# Patient Record
Sex: Male | Born: 1985 | Race: White | Hispanic: No | Marital: Single | Smoking: Never smoker
Health system: Southern US, Community
[De-identification: ages and names within clinical notes are randomized; demographics above are authoritative.]

## PROBLEM LIST (undated history)

## (undated) DIAGNOSIS — A4902 Methicillin resistant Staphylococcus aureus infection, unspecified site: Secondary | ICD-10-CM

## (undated) DIAGNOSIS — I10 Essential (primary) hypertension: Secondary | ICD-10-CM

## (undated) DIAGNOSIS — T7840XA Allergy, unspecified, initial encounter: Secondary | ICD-10-CM

## (undated) HISTORY — DX: Methicillin resistant Staphylococcus aureus infection, unspecified site: A49.02

## (undated) HISTORY — DX: Essential (primary) hypertension: I10

## (undated) HISTORY — DX: Allergy, unspecified, initial encounter: T78.40XA

---
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
## 2005-10-21 ENCOUNTER — Emergency Department (HOSPITAL_COMMUNITY): Admission: EM | Admit: 2005-10-21 | Discharge: 2005-10-21 | Payer: Self-pay | Admitting: Emergency Medicine

## 2005-10-23 ENCOUNTER — Emergency Department (HOSPITAL_COMMUNITY): Admission: EM | Admit: 2005-10-23 | Discharge: 2005-10-23 | Payer: Self-pay | Admitting: Family Medicine

## 2005-11-25 ENCOUNTER — Emergency Department (HOSPITAL_COMMUNITY): Admission: EM | Admit: 2005-11-25 | Discharge: 2005-11-25 | Payer: Self-pay | Admitting: Family Medicine

## 2005-11-27 ENCOUNTER — Emergency Department (HOSPITAL_COMMUNITY): Admission: EM | Admit: 2005-11-27 | Discharge: 2005-11-27 | Payer: Self-pay | Admitting: Family Medicine

## 2007-10-06 ENCOUNTER — Emergency Department (HOSPITAL_COMMUNITY): Admission: EM | Admit: 2007-10-06 | Discharge: 2007-10-06 | Payer: Self-pay | Admitting: Emergency Medicine

## 2009-03-09 ENCOUNTER — Emergency Department (HOSPITAL_COMMUNITY): Admission: EM | Admit: 2009-03-09 | Discharge: 2009-03-09 | Payer: Self-pay | Admitting: Family Medicine

## 2011-06-20 LAB — POCT URINALYSIS DIP (DEVICE)
Glucose, UA: NEGATIVE
Nitrite: NEGATIVE
Operator id: 235561
Protein, ur: 30 — AB
Urobilinogen, UA: 1

## 2011-06-20 LAB — URINE CULTURE

## 2012-07-10 ENCOUNTER — Ambulatory Visit (INDEPENDENT_AMBULATORY_CARE_PROVIDER_SITE_OTHER): Payer: BC Managed Care – PPO | Admitting: Family Medicine

## 2012-07-10 VITALS — BP 142/92 | HR 77 | Temp 97.9°F | Resp 16 | Ht 69.0 in | Wt 203.0 lb

## 2012-07-10 DIAGNOSIS — Z0000 Encounter for general adult medical examination without abnormal findings: Secondary | ICD-10-CM

## 2012-07-10 LAB — POCT CBC
Granulocyte percent: 68.5 % (ref 37–80)
HCT, POC: 50.9 % (ref 43.5–53.7)
Hemoglobin: 16.8 g/dL (ref 14.1–18.1)
Lymph, poc: 1.9 (ref 0.6–3.4)
MCH, POC: 29.3 pg (ref 27–31.2)
MCHC: 33 g/dL (ref 31.8–35.4)
MCV: 88.6 fL (ref 80–97)
MID (cbc): 0.5 (ref 0–0.9)
MPV: 8.7 fL (ref 0–99.8)
POC Granulocyte: 5.3 (ref 2–6.9)
POC LYMPH PERCENT: 25.3 %L (ref 10–50)
POC MID %: 6.2 %M (ref 0–12)
Platelet Count, POC: 291 10*3/uL (ref 142–424)
RBC: 5.74 M/uL (ref 4.69–6.13)
RDW, POC: 13.6 %
WBC: 7.7 10*3/uL (ref 4.6–10.2)

## 2012-07-10 NOTE — Progress Notes (Signed)
  [facility]:  Office Visit  Chief Complaint:  Chief Complaint  Patient presents with  . Annual Exam    for health insurance, form, needs lipid     HPI: Calvin Sandoval is a 26 y.o. male who complains of  Annual exam. He is a Psychologist, occupational. 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 No CP/SOB.   Past Medical History  Diagnosis Date  . MRSA infection     on elbow and knee   History reviewed. No pertinent past surgical history. History   Social History  . Marital Status: Single    Spouse Name: N/A    Number of Children: N/A  . Years of Education: N/A   Social History Main Topics  . Smoking status: Former Smoker -- 1.0 packs/day for .1 years    Types: Cigarettes  . Smokeless tobacco: Current User    Types: Chew  . Alcohol Use: Yes  . Drug Use: No  . Sexually Active: None   Other Topics Concern  . None   Social History Narrative  . None   Family History  Problem Relation Age of Onset  . Hypertension Mother   . Hypertension Father   . Diabetes Sister    Allergies  Allergen Reactions  . Sulfa Antibiotics    Prior to Admission medications   Not on File     ROS: The patient denies fevers, chills, night sweats, unintentional weight loss, chest pain, palpitations, wheezing, dyspnea on exertion, nausea, vomiting, abdominal pain, dysuria, hematuria, melena, numbness, weakness, or tingling.  All other systems have been reviewed and were otherwise negative with the exception of those mentioned in the HPI and as above.    PHYSICAL EXAM: Filed Vitals:   07/10/12 1107  BP: 142/92  Pulse: 77  Temp: 97.9 F (36.6 C)  Resp: 16   Filed Vitals:   07/10/12 1107  Height: 5\' 9"  (1.753 m)  Weight: 203 lb (92.08 kg)   Body mass index is 29.98 kg/(m^2).  General: Alert, no acute distress HEENT:  Normocephalic, atraumatic, oropharynx patent.  Cardiovascular:  Regular rate and rhythm, no rubs murmurs or gallops.  No Carotid bruits, radial pulse intact. No pedal edema.    Respiratory: Clear to auscultation bilaterally.  No wheezes, rales, or rhonchi.  No cyanosis, no use of accessory musculature GI: No organomegaly, abdomen is soft and non-tender, positive bowel sounds.  No masses. Skin: No rashes. Neurologic: Facial musculature symmetric. Psychiatric: Patient is appropriate throughout our interaction. Lymphatic: No cervical lymphadenopathy Musculoskeletal: Gait intact. GU nl, testicles nl, scrotum nl.   LABS:   EKG/XRAY:   Primary read interpreted by Dr. Conley Rolls at [facility].    ASSESSMENT/PLAN: Encounter Diagnosis  Name Primary?  . Annual physical exam Yes   Labs pending Doing well Anticipatory guidance given for age appropriate annual    Rockne Coons, DO 07/10/2012 11:26 AM

## 2012-07-11 LAB — COMPREHENSIVE METABOLIC PANEL WITH GFR
AST: 24 U/L (ref 0–37)
Albumin: 4.8 g/dL (ref 3.5–5.2)
Alkaline Phosphatase: 57 U/L (ref 39–117)
BUN: 8 mg/dL (ref 6–23)
Calcium: 10.3 mg/dL (ref 8.4–10.5)
Chloride: 102 meq/L (ref 96–112)
Glucose, Bld: 96 mg/dL (ref 70–99)
Potassium: 4.3 meq/L (ref 3.5–5.3)
Sodium: 140 meq/L (ref 135–145)
Total Protein: 7.6 g/dL (ref 6.0–8.3)

## 2012-07-11 LAB — COMPREHENSIVE METABOLIC PANEL
ALT: 32 U/L (ref 0–53)
CO2: 28 mEq/L (ref 19–32)
Creat: 1.15 mg/dL (ref 0.50–1.35)
Total Bilirubin: 0.6 mg/dL (ref 0.3–1.2)

## 2012-07-11 LAB — LIPID PANEL
Cholesterol: 162 mg/dL (ref 0–200)
HDL: 41 mg/dL (ref >39)
LDL Cholesterol: 105 mg/dL — ABNORMAL HIGH (ref 0–99)
Total CHOL/HDL Ratio: 4 Ratio
Triglycerides: 80 mg/dL (ref <150)
VLDL: 16 mg/dL (ref 0–40)

## 2012-07-13 ENCOUNTER — Encounter (HOSPITAL_BASED_OUTPATIENT_CLINIC_OR_DEPARTMENT_OTHER): Payer: Self-pay | Admitting: *Deleted

## 2012-07-13 ENCOUNTER — Other Ambulatory Visit: Payer: Self-pay | Admitting: Orthopedic Surgery

## 2012-07-13 ENCOUNTER — Encounter (HOSPITAL_BASED_OUTPATIENT_CLINIC_OR_DEPARTMENT_OTHER): Payer: Self-pay | Admitting: Anesthesiology

## 2012-07-13 ENCOUNTER — Ambulatory Visit (HOSPITAL_BASED_OUTPATIENT_CLINIC_OR_DEPARTMENT_OTHER)
Admission: RE | Admit: 2012-07-13 | Discharge: 2012-07-13 | Disposition: A | Discharge status: Home / Self Care | Payer: Self-pay | Source: Ambulatory Visit | Attending: Orthopedic Surgery | Admitting: Orthopedic Surgery

## 2012-07-13 ENCOUNTER — Encounter (HOSPITAL_BASED_OUTPATIENT_CLINIC_OR_DEPARTMENT_OTHER)
Admission: RE | Disposition: A | Discharge status: Home / Self Care | Payer: Self-pay | Source: Ambulatory Visit | Attending: Orthopedic Surgery

## 2012-07-13 ENCOUNTER — Ambulatory Visit (HOSPITAL_BASED_OUTPATIENT_CLINIC_OR_DEPARTMENT_OTHER): Payer: Self-pay | Admitting: Anesthesiology

## 2012-07-13 DIAGNOSIS — S6710XA Crushing injury of unspecified finger(s), initial encounter: Secondary | ICD-10-CM | POA: Insufficient documentation

## 2012-07-13 DIAGNOSIS — Z8614 Personal history of Methicillin resistant Staphylococcus aureus infection: Secondary | ICD-10-CM | POA: Insufficient documentation

## 2012-07-13 DIAGNOSIS — Y9269 Other specified industrial and construction area as the place of occurrence of the external cause: Secondary | ICD-10-CM | POA: Insufficient documentation

## 2012-07-13 DIAGNOSIS — S62639B Displaced fracture of distal phalanx of unspecified finger, initial encounter for open fracture: Secondary | ICD-10-CM

## 2012-07-13 DIAGNOSIS — S62639A Displaced fracture of distal phalanx of unspecified finger, initial encounter for closed fracture: Secondary | ICD-10-CM | POA: Insufficient documentation

## 2012-07-13 DIAGNOSIS — Y990 Civilian activity done for income or pay: Secondary | ICD-10-CM | POA: Insufficient documentation

## 2012-07-13 DIAGNOSIS — X58XXXA Exposure to other specified factors, initial encounter: Secondary | ICD-10-CM | POA: Insufficient documentation

## 2012-07-13 HISTORY — PX: NAILBED REPAIR: SHX5028

## 2012-07-13 SURGERY — OPEN REDUCTION INTERNAL FIXATION (ORIF) DISTAL PHALANX
Anesthesia: General | Site: Finger | Laterality: Left | Wound class: Dirty or Infected

## 2012-07-13 MED ORDER — FENTANYL CITRATE 0.05 MG/ML IJ SOLN
INTRAMUSCULAR | Status: DC | PRN
  Administered 2012-07-13: 100 ug via INTRAVENOUS

## 2012-07-13 MED ORDER — DEXAMETHASONE SODIUM PHOSPHATE 4 MG/ML IJ SOLN
INTRAMUSCULAR | Status: DC | PRN
  Administered 2012-07-13: 10 mg via INTRAVENOUS

## 2012-07-13 MED ORDER — BUPIVACAINE-EPINEPHRINE PF 0.5-1:200000 % IJ SOLN
INTRAMUSCULAR | Status: DC | PRN
  Administered 2012-07-13: 20 mL

## 2012-07-13 MED ORDER — MIDAZOLAM HCL 2 MG/2ML IJ SOLN
1.0000 mg | INTRAMUSCULAR | Status: DC | PRN
  Administered 2012-07-13: 2 mg via INTRAVENOUS

## 2012-07-13 MED ORDER — PROPOFOL 10 MG/ML IV BOLUS
INTRAVENOUS | Status: DC | PRN
  Administered 2012-07-13: 300 mg via INTRAVENOUS

## 2012-07-13 MED ORDER — ONDANSETRON HCL 4 MG/2ML IJ SOLN
INTRAMUSCULAR | Status: DC | PRN
  Administered 2012-07-13: 4 mg via INTRAVENOUS

## 2012-07-13 MED ORDER — SODIUM CHLORIDE 0.9 % IV SOLN
1000.0000 mg | INTRAVENOUS | Status: DC | PRN
  Administered 2012-07-13: 1000 mg via INTRAVENOUS

## 2012-07-13 MED ORDER — SUCCINYLCHOLINE CHLORIDE 20 MG/ML IJ SOLN
INTRAMUSCULAR | Status: DC | PRN
  Administered 2012-07-13: 100 mg via INTRAVENOUS

## 2012-07-13 MED ORDER — FENTANYL CITRATE 0.05 MG/ML IJ SOLN
50.0000 ug | Freq: Once | INTRAMUSCULAR | Status: AC
  Administered 2012-07-13: 100 ug via INTRAVENOUS

## 2012-07-13 MED ORDER — OXYCODONE-ACETAMINOPHEN 5-325 MG PO TABS: 1.0000 | ORAL_TABLET | ORAL | Status: DC | PRN

## 2012-07-13 MED ORDER — 0.9 % SODIUM CHLORIDE (POUR BTL) OPTIME
TOPICAL | Status: DC | PRN
  Administered 2012-07-13: 1000 mL

## 2012-07-13 MED ORDER — LIDOCAINE HCL (CARDIAC) 20 MG/ML IV SOLN
INTRAVENOUS | Status: DC | PRN
  Administered 2012-07-13: 100 mg via INTRAVENOUS

## 2012-07-13 MED ORDER — LACTATED RINGERS IV SOLN
INTRAVENOUS | Status: DC
  Administered 2012-07-13: 16:00:00 via INTRAVENOUS

## 2012-07-13 SURGICAL SUPPLY — 60 items
APL SKNCLS STERI-STRIP NONHPOA (GAUZE/BANDAGES/DRESSINGS)
BAG DECANTER FOR FLEXI CONT (MISCELLANEOUS) IMPLANT
BANDAGE ELASTIC 3 VELCRO ST LF (GAUZE/BANDAGES/DRESSINGS) IMPLANT
BANDAGE ELASTIC 4 VELCRO ST LF (GAUZE/BANDAGES/DRESSINGS) IMPLANT
BANDAGE GAUZE ELAST BULKY 4 IN (GAUZE/BANDAGES/DRESSINGS) IMPLANT
BENZOIN TINCTURE PRP APPL 2/3 (GAUZE/BANDAGES/DRESSINGS) IMPLANT
BLADE MINI RND TIP GREEN BEAV (BLADE) IMPLANT
BLADE SURG 15 STRL LF DISP TIS (BLADE) ×1 IMPLANT
BLADE SURG 15 STRL SS (BLADE) ×2
BNDG CMPR 9X4 STRL LF SNTH (GAUZE/BANDAGES/DRESSINGS) ×1
BNDG ESMARK 4X9 LF (GAUZE/BANDAGES/DRESSINGS) ×2 IMPLANT
CANISTER SUCTION 1200CC (MISCELLANEOUS) IMPLANT
CLOTH BEACON ORANGE TIMEOUT ST (SAFETY) ×2 IMPLANT
CORDS BIPOLAR (ELECTRODE) ×2 IMPLANT
COVER TABLE BACK 60X90 (DRAPES) ×2 IMPLANT
CUFF TOURNIQUET SINGLE 18IN (TOURNIQUET CUFF) ×2 IMPLANT
DECANTER SPIKE VIAL GLASS SM (MISCELLANEOUS) IMPLANT
DRAPE OEC MINIVIEW 54X84 (DRAPES) ×2 IMPLANT
DRAPE SURG 17X23 STRL (DRAPES) ×2 IMPLANT
DURAPREP 26ML APPLICATOR (WOUND CARE) IMPLANT
ELECT REM PT RETURN 9FT ADLT (ELECTROSURGICAL)
ELECTRODE REM PT RTRN 9FT ADLT (ELECTROSURGICAL) IMPLANT
GAUZE SPONGE 4X4 16PLY XRAY LF (GAUZE/BANDAGES/DRESSINGS) IMPLANT
GAUZE XEROFORM 1X8 LF (GAUZE/BANDAGES/DRESSINGS) ×2 IMPLANT
GLOVE BIO SURGEON STRL SZ8.5 (GLOVE) ×2 IMPLANT
GLOVE ECLIPSE 6.5 STRL STRAW (GLOVE) ×4 IMPLANT
GOWN PREVENTION PLUS XLARGE (GOWN DISPOSABLE) ×2 IMPLANT
GOWN PREVENTION PLUS XXLARGE (GOWN DISPOSABLE) ×2 IMPLANT
NEEDLE HYPO 25X1 1.5 SAFETY (NEEDLE) IMPLANT
NS IRRIG 1000ML POUR BTL (IV SOLUTION) ×2 IMPLANT
PACK BASIN DAY SURGERY FS (CUSTOM PROCEDURE TRAY) ×2 IMPLANT
PAD CAST 3X4 CTTN HI CHSV (CAST SUPPLIES) IMPLANT
PAD CAST 4YDX4 CTTN HI CHSV (CAST SUPPLIES) IMPLANT
PADDING CAST ABS 4INX4YD NS (CAST SUPPLIES)
PADDING CAST ABS COTTON 4X4 ST (CAST SUPPLIES) IMPLANT
PADDING CAST COTTON 3X4 STRL (CAST SUPPLIES)
PADDING CAST COTTON 4X4 STRL (CAST SUPPLIES)
PENCIL BUTTON HOLSTER BLD 10FT (ELECTRODE) IMPLANT
SHEET MEDIUM DRAPE 40X70 STRL (DRAPES) ×2 IMPLANT
SPLINT PLASTER CAST XFAST 3X15 (CAST SUPPLIES) IMPLANT
SPLINT PLASTER CAST XFAST 4X15 (CAST SUPPLIES) IMPLANT
SPLINT PLASTER XTRA FAST SET 4 (CAST SUPPLIES)
SPLINT PLASTER XTRA FASTSET 3X (CAST SUPPLIES)
SPONGE GAUZE 4X4 12PLY (GAUZE/BANDAGES/DRESSINGS) ×2 IMPLANT
STOCKINETTE 4X48 STRL (DRAPES) ×2 IMPLANT
STRIP CLOSURE SKIN 1/2X4 (GAUZE/BANDAGES/DRESSINGS) IMPLANT
SUCTION FRAZIER TIP 10 FR DISP (SUCTIONS) IMPLANT
SUT ETHILON 4 0 PS 2 18 (SUTURE) ×4 IMPLANT
SUT MERSILENE 4 0 P 3 (SUTURE) IMPLANT
SUT PROLENE 3 0 PS 2 (SUTURE) IMPLANT
SUT SILK 2 0 FS (SUTURE) IMPLANT
SUT VIC AB 3-0 FS2 27 (SUTURE) IMPLANT
SUT VICRYL 6 0 P 1 18 (SUTURE) ×2 IMPLANT
SUT VICRYL RAPIDE 4/0 PS 2 (SUTURE) ×2 IMPLANT
SYR BULB 3OZ (MISCELLANEOUS) ×2 IMPLANT
SYRINGE 10CC LL (SYRINGE) IMPLANT
TOWEL OR 17X24 6PK STRL BLUE (TOWEL DISPOSABLE) ×2 IMPLANT
TUBE CONNECTING 20X1/4 (TUBING) IMPLANT
UNDERPAD 30X30 INCONTINENT (UNDERPADS AND DIAPERS) ×2 IMPLANT
WATER STERILE IRR 1000ML POUR (IV SOLUTION) IMPLANT

## 2012-07-13 NOTE — Progress Notes (Signed)
  Assisted Dr. Crews with left, ultrasound guided, supraclavicular block. Side rails up, monitors on throughout procedure. See vital signs in flow sheet. Tolerated Procedure well. 

## 2012-07-13 NOTE — Anesthesia Preprocedure Evaluation (Signed)
Anesthesia Evaluation Anesthesia Physical Anesthesia Plan  ASA: I  Anesthesia Plan: General and Regional   Post-op Pain Management:    Induction:   Airway Management Planned:   Additional Equipment:   Intra-op Plan:   Post-operative Plan:   Informed Consent:   Plan Discussed with:   Anesthesia Plan Comments:         Anesthesia Quick Evaluation

## 2012-07-13 NOTE — Op Note (Signed)
See dictated note [phone]

## 2012-07-13 NOTE — Anesthesia Procedure Notes (Addendum)
Anesthesia Regional Block:  Supraclavicular block  Pre-Anesthetic Checklist: ,, timeout performed, Correct Patient, Correct Site, Correct Laterality, Correct Procedure, Correct Position, site marked, Risks and benefits discussed,  Surgical consent,  Pre-op evaluation,  At surgeon's request and post-op pain management  Laterality: Left and Upper  Prep: chloraprep       Needles:  Injection technique: Single-shot  Needle Type: Echogenic Needle     Needle Length: 5cm 5 cm Needle Gauge: 21    Additional Needles:  Procedures: ultrasound guided Supraclavicular block Narrative:  Start time: 07/13/2012 3:54 PM End time: 07/13/2012 3:59 PM Injection made incrementally with aspirations every 5 mL.  Performed by: Personally  Anesthesiologist: Sheldon Silvan  Supraclavicular block Performed by: Zenia Resides D    Procedure Name: Intubation Date/Time: 07/13/2012 4:16 PM Performed by: Zenia Resides D Pre-anesthesia Checklist: Patient identified, Emergency Drugs available, Suction available, Patient being monitored and Timeout performed Patient Re-evaluated:Patient Re-evaluated prior to inductionOxygen Delivery Method: Circle System Utilized Preoxygenation: Pre-oxygenation with 100% oxygen Intubation Type: IV induction, Cricoid Pressure applied and Rapid sequence Ventilation: Mask ventilation without difficulty Laryngoscope Size: Mac and 3 Grade View: Grade I Tube type: Oral Number of attempts: 1 Airway Equipment and Method: stylet and oral airway Placement Confirmation: ETT inserted through vocal cords under direct vision,  positive ETCO2 and breath sounds checked- equal and bilateral Secured at: 23 cm Tube secured with: Tape Dental Injury: Teeth and Oropharynx as per pre-operative assessment

## 2012-07-13 NOTE — Transfer of Care (Signed)
Immediate Anesthesia Transfer of Care Note  Patient: Calvin Sandoval  Procedure(s) Performed: Procedure(s) (LRB) with comments: OPEN REDUCTION INTERNAL FIXATION (ORIF) DISTAL PHALANX (Left) - Open treatment left distal phalanx  NAILBED REPAIR (Left) - Left index finger  Patient Location: PACU  Anesthesia Type: General and Regional  Level of Consciousness: awake, alert  and oriented  Airway & Oxygen Therapy: Patient Spontanous Breathing and Patient connected to face mask oxygen  Post-op Assessment: Report given to PACU RN and Post -op Vital signs reviewed and stable  Post vital signs: Reviewed and stable  Complications: No apparent anesthesia complications

## 2012-07-13 NOTE — Anesthesia Postprocedure Evaluation (Signed)
  Anesthesia Post-op Note  Patient: Calvin Sandoval  Procedure(s) Performed: Procedure(s) (LRB) with comments: OPEN REDUCTION INTERNAL FIXATION (ORIF) DISTAL PHALANX (Left) - Open treatment left distal phalanx  NAILBED REPAIR (Left) - Left index finger  Patient Location: PACU  Anesthesia Type: GA combined with regional for post-op pain  Level of Consciousness: awake, alert  and oriented  Airway and Oxygen Therapy: Patient Spontanous Breathing  Post-op Pain: none  Post-op Assessment: Post-op Vital signs reviewed  Post-op Vital Signs: Reviewed  Complications: No apparent anesthesia complications

## 2012-07-13 NOTE — Brief Op Note (Signed)
07/13/2012  4:45 PM  PATIENT:  Calvin Sandoval  26 y.o. male  PRE-OPERATIVE DIAGNOSIS:  Open distal phalynx fracture  POST-OPERATIVE DIAGNOSIS:  Open distal phalynx fracture  PROCEDURE:  Procedure(s) (LRB) with comments: OPEN REDUCTION INTERNAL FIXATION (ORIF) DISTAL PHALANX (Left) -   NAILBED REPAIR (Left) - Left index finger  SURGEON:  Surgeon(s) and Role:    * Marlowe Shores, MD - Primary  PHYSICIAN ASSISTANT:   ASSISTANTS: none   ANESTHESIA:   general  EBL:     BLOOD ADMINISTERED:none  DRAINS: none   LOCAL MEDICATIONS USED:  NONE  SPECIMEN:  No Specimen  DISPOSITION OF SPECIMEN:  N/A  COUNTS:  YES  TOURNIQUET:   Total Tourniquet Time Documented: Upper Arm (Left) - 16 minutes  DICTATION: .Other Dictation: Dictation Number [id number]  PLAN OF CARE: Discharge to home after PACU  PATIENT DISPOSITION:  PACU - hemodynamically stable.   Delay start of Pharmacological VTE agent (>24hrs) due to surgical blood loss or risk of bleeding: not applicable

## 2012-07-13 NOTE — H&P (Signed)
Calvin Sandoval is an 26 y.o. male.   Chief Complaint: left index crush HPI: as above s/p crush at work  Past Medical History  Diagnosis Date  . MRSA infection     on elbow and knee    History reviewed. No pertinent past surgical history.  Family History  Problem Relation Age of Onset  . Hypertension Mother   . Hypertension Father   . Diabetes Sister    Social History:  reports that he has quit smoking. His smoking use included Cigarettes. He has a .1 pack-year smoking history. His smokeless tobacco use includes Chew. He reports that he drinks alcohol. He reports that he does not use illicit drugs.  Allergies:  Allergies  Allergen Reactions  . Sulfa Antibiotics     No prescriptions prior to admission    No results found for this or any previous visit (from the past 48 hour(s)). No results found.  Review of Systems  All other systems reviewed and are negative.    Blood pressure 146/93, pulse 95, temperature 97.7 F (36.5 C), temperature source Oral, resp. rate 18, height 5\' 9"  (1.753 m), weight 91.627 kg (202 lb), SpO2 99.00%. Physical Exam  Constitutional: He is oriented to person, place, and time. He appears well-developed and well-nourished.  HENT:  Head: Normocephalic and atraumatic.  Cardiovascular: Normal rate.   Respiratory: Effort normal.  Musculoskeletal:       Hands: Neurological: He is alert and oriented to person, place, and time.  Psychiatric: He has a normal mood and affect. His behavior is normal. Judgment and thought content normal.     Assessment/Plan As above   Plan explore and repair as needed  Jael Kostick A 07/13/2012, 3:48 PM

## 2012-07-15 NOTE — Op Note (Signed)
NAME:  Calvin Sandoval, Calvin Sandoval                     ACCOUNT NO.:  MEDICAL RECORD NO.:  [id number]  LOCATION:                                 FACILITY:  PHYSICIAN:  Shulamis Wenberg A. Mina Marble, M.D.   DATE OF BIRTH:  DATE OF PROCEDURE:  07/13/2012 DATE OF DISCHARGE:                              OPERATIVE REPORT   PREOPERATIVE DIAGNOSIS:  Severe crush injury, left index finger.  POSTOPERATIVE DIAGNOSIS:  Severe crush injury, left index finger.  PROCEDURE:  I and D above with open treatment, distal phalangeal fracture, nail bed repair.  SURGEON:  Artist Pais. Mina Marble, M.D.  ASSISTANT:  None.  ANESTHESIA:  Block and general.  No complication.  No drains.  DESCRIPTION OF PROCEDURE:  The patient was taken to the operating suite. After induction of adequate general anesthesia, left upper extremity was prepped and draped in sterile fashion.  An Esmarch was used to exsanguinate the limb.  Tourniquet was then inflated to 250 mmHg.  At this point in time, the nail plate was carefully moved from the underlying nail bed.  There was a complex laceration at the intersection between the germinal sterile matrices with an open distal phalangeal fracture.  Fracture site was debrided of clot with a liter of normal saline.  The skin edges were closed with 4-0 Vicryl Rapide.  The nail bed repaired with 6-0 undyed Vicryl.  The nail plate was then placed back onto the eponychial fold and secured with 1 single 4-0 Vicryl Rapide suture distally.  Intraoperative fluoroscopy then used to help reduce the distal phalangeal fracture.  The patient was then placed on sterile dressing of Xeroform, 4x4s, and a compression wrap and a volar splint.  The patient tolerated the procedure well and went to recovery room in stable fashion.     Artist Pais Mina Marble, M.D.     MAW/MEDQ  D:  07/13/2012  T:  07/14/2012  Job:  [id number]

## 2012-07-16 ENCOUNTER — Encounter (HOSPITAL_BASED_OUTPATIENT_CLINIC_OR_DEPARTMENT_OTHER): Payer: Self-pay | Admitting: Orthopedic Surgery

## 2012-07-30 ENCOUNTER — Encounter: Payer: Self-pay | Admitting: Family Medicine

## 2013-08-02 ENCOUNTER — Ambulatory Visit (INDEPENDENT_AMBULATORY_CARE_PROVIDER_SITE_OTHER): Payer: BC Managed Care – PPO | Admitting: Physician Assistant

## 2013-08-02 VITALS — BP 138/70 | HR 78 | Temp 97.4°F | Resp 18 | Ht 68.5 in | Wt 205.0 lb

## 2013-08-02 DIAGNOSIS — Z0000 Encounter for general adult medical examination without abnormal findings: Secondary | ICD-10-CM

## 2013-08-02 NOTE — Progress Notes (Signed)
Patient ID: MARLOW BERENGUER MRN: [id number], DOB: August 24, 1986 27 y.o. Date of Encounter: 08/02/2013, 9:32 AM  Primary Physician: No PCP Per Patient  Chief Complaint: Physical (CPE)  HPI: 27 y.o. male with history noted below here for CPE. Doing well. No issues/complaints. Last CPE was on 07/10/12. Interested to quitting dipping. Has been dipping for 2-3 years. Can go all day without dipping without any issues but he enjoys this while he is working and while he is with his friends. He has not yet tried any methods to quit. Interested in Nicorette gum. Has to young children, a daughter and a son, he does not want to see his son dip. 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 From [place]. Tries to eat healthy and gets some exercise in through running when he can.   Needs insurance form completed.   Review of Systems: Consitutional: No fever, chills, fatigue, night sweats, lymphadenopathy, or weight changes. Eyes: No visual changes, eye redness, or discharge. ENT/Mouth: Ears: No otalgia, tinnitus, hearing loss, discharge. Nose: No congestion, rhinorrhea, sinus pain, or epistaxis. Throat: No sore throat, post nasal drip, or teeth pain. Cardiovascular: No CP, palpitations, diaphoresis, DOE, edema, orthopnea, PND. Respiratory: No cough, hemoptysis, SOB, or wheezing. Gastrointestinal: No anorexia, dysphagia, reflux, pain, nausea, vomiting, hematemesis, diarrhea, constipation, BRBPR, or melena. Genitourinary: No dysuria, frequency, urgency, hematuria, incontinence, nocturia, decreased urinary stream, discharge, impotence, or testicular pain/masses. Musculoskeletal: No decreased ROM, myalgias, stiffness, joint swelling, or weakness. Skin: No rash, erythema, lesion changes, pain, warmth, jaundice, or pruritis. Neurological: No headache, dizziness, syncope, seizures, tremors, memory loss, coordination problems, or paresthesias. Psychological: No anxiety, depression, hallucinations, SI/HI. Endocrine:  No fatigue, polydipsia, polyphagia, polyuria, or known diabetes.   Past Medical History  Diagnosis Date  . MRSA infection     on elbow and knee     Past Surgical History  Procedure Laterality Date  . Nailbed repair  07/13/2012    Procedure: NAILBED REPAIR;  Surgeon: Marlowe Shores, MD;  Location: [facility];  Service: Orthopedics;  Laterality: Left;  Left index finger    Home Meds:  Prior to Admission medications   Not on File    Allergies:  Allergies  Allergen Reactions  . Sulfa Antibiotics     History   Social History  . Marital Status: Single    Spouse Name: N/A    Number of Children: N/A  . Years of Education: N/A   Occupational History  . Not on file.   Social History Main Topics  . Smoking status: Former Smoker -- 1.00 packs/day for .1 years    Types: Cigarettes  . Smokeless tobacco: Current User    Types: Chew  . Alcohol Use: Yes  . Drug Use: No  . Sexual Activity: Not on file   Other Topics Concern  . Not on file   Social History Narrative  . No narrative on file    Family History  Problem Relation Age of Onset  . Hypertension Mother   . Hypertension Father   . Diabetes Sister     Physical Exam: Blood pressure 138/70, pulse 78, temperature 97.4 F (36.3 C), temperature source Oral, resp. rate 18, height 5' 8.5" (1.74 m), weight 205 lb (92.987 kg), SpO2 99.00%.  General: Well developed, well nourished, in no acute distress. HEENT: Normocephalic, atraumatic. Conjunctiva pink, sclera non-icteric. Pupils 2 mm constricting to 1 mm, round, regular, and equally reactive to light and accomodation. EOMI. Internal auditory canal clear. TMs  with good cone of light and without pathology. Nasal mucosa pink. Nares are without discharge. No sinus tenderness. Oral mucosa pink. Dentition normal. Pharynx without exudate.   Neck: Supple. Trachea midline. No thyromegaly. Full ROM. No lymphadenopathy. Lungs: Clear to auscultation bilaterally  without wheezes, rales, or rhonchi. Breathing is of normal effort and unlabored. Cardiovascular: RRR with S1 S2. No murmurs, rubs, or gallops appreciated. Distal pulses 2+ symmetrically. No carotid or abdominal bruits. Abdomen: Soft, non-tender, non-distended with normoactive bowel sounds. No hepatosplenomegaly or masses. No rebound/guarding. No CVA tenderness. Without hernias.  Genitourinary: Circumcised male. No penile lesions. Testes descended bilaterally, and smooth without tenderness or masses.  Musculoskeletal: Full range of motion and 5/5 strength throughout. Without swelling, atrophy, tenderness, crepitus, or warmth. Extremities without clubbing, cyanosis, or edema. Calves supple. Skin: Warm and moist without erythema, ecchymosis, wounds, or rash. Neuro: A+Ox3. CN II-XII grossly intact. Moves all extremities spontaneously. Full sensation throughout. Normal gait. DTR 2+ throughout upper and lower extremities. Finger to nose intact. Psych:  Responds to questions appropriately with a normal affect.   Studies:   Declined. Had FLP the previous year that was normal.    Assessment/Plan:  27 y.o. male here for CPE with insurance form completion.  -Insurance form completed -Discussed options for dipping cessation, patient to try Nicorette gum first if he is not successful with this he will call back for further options. No history of depression.  -Risks of dipping discussed -Healthy diet and exercise -Weight loss -Anticipatory guidance   Signed, Eula Listen, PA-C [facility] [place], [place] [address] [phone] 08/02/2013 9:32 AM

## 2014-08-03 ENCOUNTER — Ambulatory Visit (INDEPENDENT_AMBULATORY_CARE_PROVIDER_SITE_OTHER): Payer: BC Managed Care – PPO | Admitting: Family Medicine

## 2014-08-03 VITALS — BP 138/84 | HR 64 | Temp 98.1°F | Resp 16 | Ht 70.0 in | Wt 203.2 lb

## 2014-08-03 DIAGNOSIS — Z720 Tobacco use: Secondary | ICD-10-CM

## 2014-08-03 DIAGNOSIS — Z0000 Encounter for general adult medical examination without abnormal findings: Secondary | ICD-10-CM

## 2014-08-03 DIAGNOSIS — F17200 Nicotine dependence, unspecified, uncomplicated: Secondary | ICD-10-CM

## 2014-08-03 DIAGNOSIS — F1010 Alcohol abuse, uncomplicated: Secondary | ICD-10-CM | POA: Insufficient documentation

## 2014-08-03 MED ORDER — BUPROPION HCL ER (SR) 150 MG PO TB12: ORAL_TABLET | ORAL | Status: DC

## 2014-08-03 NOTE — Progress Notes (Signed)
Calvin Sandoval is a 28 y.o. male presenting for annual physical exam.  Medical care team includes:  PCP: No PCP Per Patient, last annual 1 year ago  Vision: [facility], visit set up in 2 weeks. Dental: Not in the past 2 years. No other specialists.  Concerns:  Patient states that he wants to stop using tobacco dip and alcohol use.  Tobacco - patients uses dip every day for the past 5 years. Father has been a heavy smoker most of his life, mother has COPD, quit smoking. Patient states he knows its a bad habit and can have serious health consequences. Has never tried quitting but would like to know his options. States that he can go 1 day without dip but becomes irritable and agitated. Has thought about using nicotine chewing gum but never followed through. Denies any tooth pain/lesions, bleeding gums, oral lesions, throat pain.  Alcohol - drinks 24 pack of beers Saturday and Sunday for past 5 years. Patient had a DWI in 09/2013. Has been attending classes as required by court. Reports that part of the issues with his marriage had to do with alcohol use. Other close friends and family have asked him to stop drinking or told him they were concerned. Patient has never tried to quit drinking but does feel like he does not need it unlike the tobacco dip. Reports family history with grandfather and uncle, both paternal, of alcoholism and liver cirrhosis.  Denies any other aggravating or relieving factors, no other questions or concerns.  Immunizations: Tetanus 2 years ago. Not interested in flu vaccine.  No current medications.  Allergies  Allergen Reactions  . Sulfa Antibiotics    Past Medical History  Diagnosis Date  . MRSA infection     on elbow and knee   Past Surgical History  Procedure Laterality Date  . Nailbed repair  07/13/2012    Procedure: NAILBED REPAIR;  Surgeon: Marlowe ShoresMatthew A Weingold, MD;  Location: [facility];  Service: Orthopedics;  Laterality: Left;   Left index finger   Family History  Problem Relation Age of Onset  . Hypertension Mother   . Obesity Mother   . Diabetes Mother   . Hypertension Father   . Diabetes Sister   . Diabetes Sister   . Alcohol abuse Paternal Grandfather   . Alcohol abuse Paternal Uncle    History   Social History  . Marital Status: Single    Spouse Name: N/A    Number of Children: N/A  . Years of Education: N/A   Occupational History  . Not on file.   Social History Main Topics  . Smoking status: Former Smoker -- 1.00 packs/day for .1 years    Types: Cigarettes  . Smokeless tobacco: Current User    Types: Chew  . Alcohol Use: 14.4 oz/week    24 Cans of beer per week  . Drug Use: No  . Sexual Activity: Yes   Other Topics Concern  . Not on file   Social History Narrative   Patient is married but separated since 2012, still trying to reconcile, works as a Psychologist, occupationalwelder, 4 night shifts per week, 2 children, 5 y/o (DOB 2010) and 4 y/o (2011), eating is inconsistent, snacks a lot, stays pretty busy with work, does not exercise.    Review of Systems  Constitutional: Negative for fever, chills, weight loss and malaise/fatigue.  HENT: Negative for congestion, ear pain, hearing loss, sore throat and tinnitus.   Eyes: Negative for  blurred vision, double vision, pain and redness.  Respiratory: Negative for cough, shortness of breath and wheezing.   Cardiovascular: Negative for chest pain, palpitations, orthopnea and leg swelling.  Gastrointestinal: Negative for nausea, vomiting, abdominal pain, diarrhea, constipation and blood in stool.  Genitourinary: Negative for dysuria, hematuria and flank pain.  Musculoskeletal: Positive for joint pain (occasional knee pain). Negative for myalgias.  Skin: Negative for rash.  Neurological: Negative for dizziness, tremors and headaches.  Endo/Heme/Allergies: Negative for polydipsia.  Psychiatric/Behavioral: Negative for depression and substance abuse. The patient does  not have insomnia.      Objective:   Vitals BP 138/84 mmHg  Pulse 64  Temp(Src) 98.1 F (36.7 C) (Oral)  Resp 16  Ht 5\' 10"  (1.778 m)  Wt 203 lb 3.2 oz (92.171 kg)  BMI 29.16 kg/m2  SpO2 100%  Physical Exam  Constitutional: He is oriented to person, place, and time and well-developed, well-nourished, and in no distress. No distress.  HENT:  Head: Normocephalic and atraumatic.  Right Ear: External ear normal.  Left Ear: External ear normal.  Nose: Nose normal.  Mouth/Throat: Oropharynx is clear and moist. No oropharyngeal exudate.  Eyes: Conjunctivae and EOM are normal. Pupils are equal, round, and reactive to light. Right eye exhibits no discharge. Left eye exhibits no discharge. No scleral icterus.  Neck: Normal range of motion. Neck supple. No thyromegaly present.  Cardiovascular: Normal rate, regular rhythm, normal heart sounds and intact distal pulses.  Exam reveals no gallop and no friction rub.   No murmur heard. Pulmonary/Chest: Effort normal and breath sounds normal. No stridor. No respiratory distress. He has no wheezes. He exhibits no tenderness.  Abdominal: Soft. Bowel sounds are normal. He exhibits no distension and no mass. There is no tenderness.  Genitourinary:  Patient declined.  Musculoskeletal: Normal range of motion. He exhibits no edema or tenderness.  Lymphadenopathy:    He has no cervical adenopathy.  Neurological: He is alert and oriented to person, place, and time. He has normal reflexes.  Skin: Skin is warm and dry. He is not diaphoretic.  Psychiatric: Mood normal.  Flat affect.  Vitals reviewed.   Assessment and Plan :   Discussed healthy lifestyle, diet, exercise, preventative care, vaccinations, and addressed patient's concerns. Plan for follow up in 4 weeks. Otherwise, plan for specific conditions below.  1. Annual physical exam Pending labs, medically in good health. Next annual PE in 1 year. - Vitamin D 1,25 dihydroxy - Comprehensive  metabolic panel - Lipid panel - CBC - TSH  2. Alcohol abuse Alcohol cessation counseling, instructions provided, [facility], AA meetings, follow up in 4 weeks. - Vitamin D  3. Tobacco chew use 4. Tobacco use disorder Smoking cessation counseling, instructions provided, Rx Wellbutrin, follow up in 4 weeks. - buPROPion (WELLBUTRIN SR) 150 MG 12 hr tablet; Take 1 tablet for the first 3 days, then 1 tablet twice a day.  Dispense: 30 tablet; Refill: 1   Wallis BambergMario Telly Broberg, PA-C [facility] [phone] 08/03/2014 4:32 PM

## 2014-08-03 NOTE — Patient Instructions (Addendum)
Please start Wellbutrin 150mg  once a day for three days, then take 150mg  twice a day for help quitting tobacco use.  What are the benefits of quitting smoking/chewing tobacco? - Quitting nicotine can lower your chances of getting or dying from heart disease, lung disease, kidney failure, infection, or cancer. It can also lower your chances of getting osteoporosis, a condition that makes your bones weak. Plus, quitting nicotine can help your skin look younger and reduce the chances that you will have problems with sex.  Quitting nicotine will improve your health no matter how old you are, and no matter how long or how much you have smoked.  What should I do if I want to quit nicotine? - The letters in the word "START" can help you remember the steps to take:  S = Set a quit date.  T = Tell family, friends, and the people around you that you plan to quit.  A = Anticipate or plan ahead for the tough times you'll face while quitting.  R = Remove cigarettes and other tobacco products from your home, car, and work.  T = Talk to your doctor about getting help to quit.  How can my doctor or nurse help? - Your doctor or nurse can give you advice on the best way to quit. He or she can also put you in touch with counselors or other people you can call for support. Plus, your doctor or nurse can give you medicines to:  - Reduce your craving for nicotine - Reduce the unpleasant symptoms that happen when you stop using nicotine (called "withdrawal symptoms").  You can also get help from a free phone line (1-800-QUIT-NOW) or go online to [employer]dkwww.smokefree.gov.  What are the symptoms of withdrawal? - The symptoms include:  - Trouble sleeping - Being irritable, anxious or restless - Getting frustrated or angry - Having trouble thinking clearly Some people who stop nicotine use become temporarily depressed. Some of them need treatment for depression, such as counseling or antidepressant medicines. If you  get depressed when you quit smoking, tell your doctor or nurse about it.  How do medicines help? - Different medicines work in different ways:  - Nicotine replacement therapy eases withdrawal and reduces your body's craving for nicotine, the main drug found in cigarettes. Non-prescription forms of nicotine replacement include skin patches, lozenges, and gum. Prescription forms include nasal sprays and "puffers" or inhalers. - Bupropion is a prescription medicine that reduces your desire to smoke. This medicine is sold under the brand names Zyban and Wellbutrin. It is also available in a generic version, which is cheaper than brand-name medicines.  - Varenicline (brand name: Chantix) is a prescription medicine that reduces withdrawal symptoms and cigarette cravings. If you think you'd like to take varenicline and you have a history of depression, anxiety, or heart disease, discuss this with your doctor or nurse before taking the medicine. Varenicline can also increase the effects of alcohol in some people. It's a good idea to limit drinking while you're taking it, at least until you know how it affects you. If you take bupropion or varenicline and you have any of the following symptoms, stop taking the medicine and call your doctor or nurse:  ? Become very nervous ? Become depressed ? Start to do strange things ? Think about killing yourself  How does counseling work? - Counseling can happen during formal office visits or just over the phone. A counselor can help you:  ? Figure out what  triggers your nicotine use and what to do instead ? Overcome cravings ? Figure out what went wrong when you tried to quit before  What works best? - Studies show that people have the best luck at quitting if they take medicines to help them quit and work with a Veterinary surgeoncounselor. It might also be helpful to combine nicotine replacement with one of the prescription medicines that help people quit. In some cases, it might  even make sense to take bupropion and varenicline together.    Will I gain weight if I quit? - Yes, you might gain a few pounds. But quitting smoking will have a much more positive effect on your health than weighing a few pounds more. Plus, you can help prevent some weight gain by being more active and eating less. Taking the medicine bupropion might help control weight gain.  What else can I do to improve my chances of quitting? - You can:  ? Start exercising. ? Stay away from smokers and places that you associate with smoking/chewing tobacco. If people close to you smoke, ask them to quit with you. ? Keep gum, hard candy, or something to put in your mouth handy. If you get a craving for a chewing tobacco, try one of these instead. ? Don't give up, even if you start using nicotine again. It takes most people a few tries before they succeed.     For quitting alcohol use please start cutting down over the next 2 weeks to half of what you normally drink. Then cut that to half for another 2 weeks. On your 5th week, try to abstain from alcohol use. If you are having any difficulty, please contact us at [facility].   I also encourage you to contact [facility] and ask about their Intensive Outpatient Program for alcohol use. I also encourage you to begin attending Alcoholics Anonymous meetings for the next 4 weeks. The website for [facility] is [email or web address] and their phone is [phone].  Please return to our clinic for follow up in 4 weeks, between December 1st - 3rd, 2015. Ask for Calvin Sandoval.

## 2014-08-04 LAB — COMPREHENSIVE METABOLIC PANEL
ALBUMIN: 4.9 g/dL (ref 3.5–5.2)
ALT: 21 U/L (ref 0–53)
AST: 22 U/L (ref 0–37)
Alkaline Phosphatase: 75 U/L (ref 39–117)
BILIRUBIN TOTAL: 0.6 mg/dL (ref 0.2–1.2)
BUN: 11 mg/dL (ref 6–23)
CALCIUM: 9.8 mg/dL (ref 8.4–10.5)
CHLORIDE: 100 meq/L (ref 96–112)
CO2: 28 meq/L (ref 19–32)
Creat: 1.02 mg/dL (ref 0.50–1.35)
GLUCOSE: 96 mg/dL (ref 70–99)
Potassium: 4.3 mEq/L (ref 3.5–5.3)
SODIUM: 138 meq/L (ref 135–145)
TOTAL PROTEIN: 7.4 g/dL (ref 6.0–8.3)

## 2014-08-04 LAB — LIPID PANEL
CHOLESTEROL: 142 mg/dL (ref 0–200)
HDL: 38 mg/dL — ABNORMAL LOW (ref >39)
LDL Cholesterol: 78 mg/dL (ref 0–99)
TRIGLYCERIDES: 132 mg/dL (ref <150)
Total CHOL/HDL Ratio: 3.7 Ratio
VLDL: 26 mg/dL (ref 0–40)

## 2014-08-04 LAB — CBC
HEMATOCRIT: 44.7 % (ref 39.0–52.0)
HEMOGLOBIN: 15.7 g/dL (ref 13.0–17.0)
MCH: 28.6 pg (ref 26.0–34.0)
MCHC: 35.1 g/dL (ref 30.0–36.0)
MCV: 81.4 fL (ref 78.0–100.0)
Platelets: 332 10*3/uL (ref 150–400)
RBC: 5.49 MIL/uL (ref 4.22–5.81)
RDW: 13.1 % (ref 11.5–15.5)
WBC: 7 10*3/uL (ref 4.0–10.5)

## 2014-08-04 LAB — TSH: TSH: 1.379 u[IU]/mL (ref 0.350–4.500)

## 2014-08-06 LAB — VITAMIN D 1,25 DIHYDROXY
VITAMIN D 1, 25 (OH) TOTAL: 53 pg/mL (ref 18–72)
Vitamin D3 1, 25 (OH)2: 53 pg/mL

## 2014-08-11 ENCOUNTER — Encounter: Payer: Self-pay | Admitting: Urgent Care

## 2015-08-07 ENCOUNTER — Ambulatory Visit (INDEPENDENT_AMBULATORY_CARE_PROVIDER_SITE_OTHER): Payer: BLUE CROSS/BLUE SHIELD | Admitting: Internal Medicine

## 2015-08-07 VITALS — BP 130/80 | HR 85 | Temp 97.3°F | Resp 16 | Ht 68.4 in | Wt 203.0 lb

## 2015-08-07 DIAGNOSIS — Z0000 Encounter for general adult medical examination without abnormal findings: Secondary | ICD-10-CM | POA: Diagnosis not present

## 2015-08-07 DIAGNOSIS — Z833 Family history of diabetes mellitus: Secondary | ICD-10-CM

## 2015-08-07 DIAGNOSIS — F17290 Nicotine dependence, other tobacco product, uncomplicated: Secondary | ICD-10-CM

## 2015-08-07 DIAGNOSIS — F17200 Nicotine dependence, unspecified, uncomplicated: Secondary | ICD-10-CM

## 2015-08-07 LAB — POC MICROSCOPIC URINALYSIS (UMFC): Mucus: ABSENT

## 2015-08-07 LAB — POCT CBC
GRANULOCYTE PERCENT: 70.9 % (ref 37–80)
HCT, POC: 48.4 % (ref 43.5–53.7)
HEMOGLOBIN: 16.6 g/dL (ref 14.1–18.1)
Lymph, poc: 1.8 (ref 0.6–3.4)
MCH: 29.2 pg (ref 27–31.2)
MCHC: 34.3 g/dL (ref 31.8–35.4)
MCV: 85.1 fL (ref 80–97)
MID (cbc): 0.4 (ref 0–0.9)
MPV: 7.2 fL (ref 0–99.8)
POC GRANULOCYTE: 5.2 (ref 2–6.9)
POC LYMPH PERCENT: 24 %L (ref 10–50)
POC MID %: 5.1 % (ref 0–12)
Platelet Count, POC: 241 10*3/uL (ref 142–424)
RBC: 5.69 M/uL (ref 4.69–6.13)
RDW, POC: 13.4 %
WBC: 7.3 10*3/uL (ref 4.6–10.2)

## 2015-08-07 LAB — POCT URINALYSIS DIP (MANUAL ENTRY)
Bilirubin, UA: NEGATIVE
Blood, UA: NEGATIVE
GLUCOSE UA: NEGATIVE
LEUKOCYTES UA: NEGATIVE
NITRITE UA: NEGATIVE
Protein Ur, POC: NEGATIVE
Spec Grav, UA: 1.025
UROBILINOGEN UA: 1
pH, UA: 5.5

## 2015-08-07 LAB — GLUCOSE, POCT (MANUAL RESULT ENTRY): POC GLUCOSE: 94 mg/dL (ref 70–99)

## 2015-08-07 NOTE — Progress Notes (Signed)
Patient ID: Calvin Sandoval, male   DOB: 02-Dec-1985, 29 y.o.   MRN: [id number]   08/07/2015 at 1:06 PM  Calvin Sandoval / DOB: 02-Dec-1985 / MRN: [id number]  Problem list reviewed and updated by me where necessary.   SUBJECTIVE  Calvin Sandoval is a 29 y.o. well appearing male presenting for the chief complaint of needing an annual exam..     He  has a past medical history of MRSA infection.    Medications reviewed and updated by myself where necessary, and exist elsewhere in the encounter.   Calvin Sandoval is allergic to sulfa antibiotics. He  reports that he has quit smoking. His smoking use included Cigarettes. He has a .1 pack-year smoking history. His smokeless tobacco use includes Chew. He reports that he drinks about 14.4 oz of alcohol per week. He reports that he does not use illicit drugs. He  reports that he currently engages in sexual activity. The patient  has past surgical history that includes Nailbed repair (07/13/2012).  His family history includes Alcohol abuse in his paternal grandfather and paternal uncle; Diabetes in his mother, sister, and sister; Hypertension in his father and mother; Obesity in his mother.  Review of Systems  Constitutional: Negative.   HENT: Negative.   Eyes: Negative.   Respiratory: Negative.   Cardiovascular: Negative.   Genitourinary: Negative.   Musculoskeletal: Negative.   Skin: Negative.   Neurological: Negative.   Endo/Heme/Allergies: Negative.   Psychiatric/Behavioral: Negative.     OBJECTIVE  His  height is 5' 8.4" (1.737 m) and weight is 203 lb (92.08 kg). His oral temperature is 97.3 F (36.3 C). His blood pressure is 130/80 and his pulse is 85. His respiration is 16 and oxygen saturation is 99%.  The patient's body mass index is 30.52 kg/(m^2).  Physical Exam  Vitals reviewed. Constitutional: He is oriented to person, place, and time. He appears well-developed and well-nourished. No distress.  HENT:  Head: Normocephalic.  Right Ear:  External ear normal.  Left Ear: External ear normal.  Nose: Nose normal.  Mouth/Throat: Oropharynx is clear and moist.  Eyes: Conjunctivae and EOM are normal. Pupils are equal, round, and reactive to light. No scleral icterus.  Neck: Normal range of motion. Neck supple. No tracheal deviation present. No thyromegaly present.  Cardiovascular: Normal rate, regular rhythm and normal heart sounds.   Respiratory: Effort normal and breath sounds normal.  GI: Soft. Bowel sounds are normal.  Genitourinary: Penis normal.  Musculoskeletal: Normal range of motion.  Lymphadenopathy:    He has no cervical adenopathy.  Neurological: He is alert and oriented to person, place, and time. No cranial nerve deficit. He exhibits normal muscle tone. Coordination normal.  Psychiatric: He has a normal mood and affect. His behavior is normal. Judgment and thought content normal.    Results for orders placed or performed in visit on 08/07/15 (from the past 24 hour(s))  POCT CBC     Status: None   Collection Time: 08/07/15 12:50 PM  Result Value Ref Range   WBC 7.3 4.6 - 10.2 K/uL   Lymph, poc 1.8 0.6 - 3.4   POC LYMPH PERCENT 24.0 10 - 50 %L   MID (cbc) 0.4 0 - 0.9   POC MID % 5.1 0 - 12 %M   POC Granulocyte 5.2 2 - 6.9   Granulocyte percent 70.9 37 - 80 %G   RBC 5.69 4.69 - 6.13 M/uL   Hemoglobin 16.6 14.1 - 18.1 g/dL   HCT,  POC 48.4 43.5 - 53.7 %   MCV 85.1 80 - 97 fL   MCH, POC 29.2 27 - 31.2 pg   MCHC 34.3 31.8 - 35.4 g/dL   RDW, POC 16.1 %   Platelet Count, POC 241 142 - 424 K/uL   MPV 7.2 0 - 99.8 fL  POCT glucose (manual entry)     Status: None   Collection Time: 08/07/15 12:51 PM  Result Value Ref Range   POC Glucose 94 70 - 99 mg/dl  POCT Microscopic Urinalysis ([facility])     Status: Abnormal   Collection Time: 08/07/15 12:51 PM  Result Value Ref Range   WBC,UR,HPF,POC None None WBC/hpf   RBC,UR,HPF,POC None None RBC/hpf   Bacteria None None, Too numerous to count   Mucus Absent Absent    Epithelial Cells, UR Per Microscopy Few (A) None, Too numerous to count cells/hpf  POCT urinalysis dipstick     Status: Abnormal   Collection Time: 08/07/15 12:51 PM  Result Value Ref Range   Color, UA yellow yellow   Clarity, UA clear clear   Glucose, UA negative negative   Bilirubin, UA negative negative   Ketones, POC UA trace (5) (A) negative   Spec Grav, UA 1.025    Blood, UA negative negative   pH, UA 5.5    Protein Ur, POC negative negative   Urobilinogen, UA 1.0    Nitrite, UA Negative Negative   Leukocytes, UA Negative Negative    ASSESSMENT & PLAN  Calvin Sandoval was seen today for annual exam.  Diagnoses and all orders for this visit:  FHx: diabetes mellitus -     POCT CBC -     POCT glucose (manual entry) -     POCT Microscopic Urinalysis ([facility]) -     POCT urinalysis dipstick -     Comprehensive metabolic panel -     Lipid panel -     TSH  Annual physical exam -     POCT CBC -     POCT glucose (manual entry) -     POCT Microscopic Urinalysis ([facility]) -     POCT urinalysis dipstick -     Comprehensive metabolic panel -     Lipid panel -     TSH  Dependence on nicotine from other tobacco product -     POCT CBC -     POCT glucose (manual entry) -     POCT Microscopic Urinalysis ([facility]) -     POCT urinalysis dipstick -     Comprehensive metabolic panel -     Lipid panel -     TSH   Niccorrete and tobacco counseling done

## 2015-08-07 NOTE — Patient Instructions (Signed)
Tobacco Use Disorder Tobacco use disorder (TUD) is a mental disorder. It is the long-term use of tobacco in spite of related health problems or difficulty with normal life activities. Tobacco is most commonly smoked as cigarettes and less commonly as cigars or pipes. Smokeless chewing tobacco and snuff are also popular. People with TUD get a feeling of extreme pleasure (euphoria) from using tobacco and have a desire to use it again and again. Repeated use of tobacco can cause problems. The addictive effects of tobacco are due mainly tothe ingredient nicotine. Nicotine also causes a rush of adrenaline (epinephrine) in the body. This leads to increased blood pressure, heart rate, and breathing rate. These changes may cause problems for people with high blood pressure, weak hearts, or lung disease. High doses of nicotine in children and pets can lead to seizures and death.  Tobacco contains a number of other unsafe chemicals. These chemicals are especially harmful when inhaled as smoke and can damage almost every organ in the body. Smokers live shorter lives than nonsmokers and are at risk of dying from a number of diseases and cancers. Tobacco smoke can also cause health problems for nonsmokers (due to inhaling secondhand smoke). Smoking is also a fire hazard.  TUD usually starts in the late teenage years and is most common in young adults between the ages of 18 and 25 years. People who start smoking earlier in life are more likely to continue smoking as adults. TUD is somewhat more common in men than women. People with TUD are at higher risk for using alcohol and other drugs of abuse. RISK FACTORS Risk factors for TUD include:   Having family members with the disorder.  Being around people who use tobacco.  Having an existing mental health issue such as schizophrenia, depression, bipolar disorder, ADHD, or posttraumatic stress disorder (PTSD). SIGNS AND SYMPTOMS  People with tobacco use disorder have  two or more of the following signs and symptoms within 12 months:   Use of more tobacco over a longer period than intended.   Not able to cut down or control tobacco use.   A lot of time spent obtaining or using tobacco.   Strong desire or urge to use tobacco (craving). Cravings may last for 6 months or longer after quitting.  Use of tobacco even when use leads to major problems at work, school, or home.   Use of tobacco even when use leads to relationship problems.   Giving up or cutting down on important life activities because of tobacco use.   Repeatedly using tobacco in situations where it puts you or others in physical danger, like smoking in bed.   Use of tobacco even when it is known that a physical or mental problem is likely related to tobacco use.   Physical problems are numerous and may include chronic bronchitis, emphysema, lung and other cancers, gum disease, high blood pressure, heart disease, and stroke.   Mental problems caused by tobacco may include difficulty sleeping and anxiety.  Need to use greater amounts of tobacco to get the same effect. This means you have developed a tolerance.   Withdrawal symptoms as a result of stopping or rapidly cutting back use. These symptoms may last a month or more after quitting and include the following:   Depressed, anxious, or irritable mood.   Difficulty concentrating.   Increased appetite.  Restlessness or trouble sleeping.   Use of tobacco to avoid withdrawal symptoms. DIAGNOSIS  Tobacco use disorder is diagnosed by   your health care provider. A diagnosis may be made by:  Your health care provider asking questions about your tobacco use and any problems it may be causing.  A physical exam.  Lab tests.  You may be referred to a mental health professional or addiction specialist. The severity of tobacco use disorder depends on the number of signs and symptoms you have:   Mild--Two or three  symptoms.  Moderate--Four or five symptoms.   Severe--Six or more symptoms.  TREATMENT  Many people with tobacco use disorder are unable to quit on their own and need help. Treatment options include the following:  Nicotine replacement therapy (NRT). NRT provides nicotine without the other harmful chemicals in tobacco. NRT gradually lowers the dosage of nicotine in the body and reduces withdrawal symptoms. NRT is available in over-the-counter forms (gum, lozenges, and skin patches) as well as prescription forms (mouth inhaler and nasal spray).  Medicines.This may include:  Antidepressant medicine that may reduce nicotine cravings.  A medicine that acts on nicotine receptors in the brain to reduce cravings and withdrawal symptoms. It may also block the effects of tobacco in people with TUD who relapse.  Counseling or talk therapy. A form of talk therapy called behavioral therapy is commonly used to treat people with TUD. Behavioral therapy looks at triggers for tobacco use, how to avoid them, and how to cope with cravings. It is most effective in person or by phone but is also available in self-help forms (books and Internet websites).  Support groups. These provide emotional support, advice, and guidance for quitting tobacco. The most effective treatment for TUD is usually a combination of medicine, talk therapy, and support groups. HOME CARE INSTRUCTIONS  Keep all follow-up visits as directed by your health care provider. This is important.  Take medicines only as directed by your health care provider.  Check with your health care provider before starting new prescription or over-the-counter medicines. SEEK MEDICAL CARE IF:  You are not able to take your medicines as prescribed.  Treatment is not helping your TUD and your symptoms get worse. SEEK IMMEDIATE MEDICAL CARE IF:  You have serious thoughts about hurting yourself or others.  You have trouble breathing, chest pain,  sudden weakness, or sudden numbness in part of your body.   This information is not intended to replace advice given to you by your health care provider. Make sure you discuss any questions you have with your health care provider.   Document Released: 05/22/2004 Document Revised: 10/07/2014 Document Reviewed: 11/12/2013 Elsevier Interactive Patient Education 2016 [employer] Nicotine chewing gum What is this medicine? NICOTINE (NIK oh teen) helps people stop smoking. This medicine replaces the nicotine found in cigarettes and helps to decrease withdrawal effects. It is most effective when used in combination with a stop-smoking program. This medicine may be used for other purposes; ask your health care provider or pharmacist if you have questions. What should I tell my health care provider before I take this medicine? They need to know if you have any of these conditions: -diabetes -heart disease, angina, irregular heartbeat or previous heart attack -high blood pressure -lung disease, including asthma -overactive thyroid -pheochromocytoma -seizures or history of seizures -stomach problems or ulcers -an unusual or allergic reaction to nicotine, other medicines, foods, dyes, or preservatives -pregnant or trying to get pregnant -breast-feeding How should I use this medicine? Chew but do not swallow the gum. Follow the directions that come with the chewing gum. Use exactly as directed. When  you feel an urgent desire for a cigarette, chew one piece of gum slowly. Continue chewing until you taste the gum or feel a slight tingling in your mouth. Then, stop chewing and place the gum between your cheek and gum. Wait until the taste or tingling is almost gone then start chewing again. Continue chewing in this manner for about 30 minutes. Slow chewing helps reduce cravings and also helps reduce the chance for heartburn or other gastrointestinal side effects. Talk to your pediatrician regarding the  use of this medicine in children. Special care may be needed. Overdosage: If you think you have taken too much of this medicine contact a poison control center or emergency room at once. NOTE: This medicine is only for you. Do not share this medicine with others. What if I miss a dose? This does not apply. Only use the chewing gum when you have a strong desire to smoke. Do not use more than one piece of gum at a time. What may interact with this medicine? -medicines for asthma -medicines for blood pressure -medicines for mental depression This list may not describe all possible interactions. Give your health care provider a list of all the medicines, herbs, non-prescription drugs, or dietary supplements you use. Also tell them if you smoke, drink alcohol, or use illegal drugs. Some items may interact with your medicine. What should I watch for while using this medicine? Always carry the nicotine gum with you. Do not use more than 30 pieces of gum a day. Too much gum can increase the risk of an overdose. As the urge to smoke gets less, gradually reduce the number of pieces each day over a period of 2 to 3 months. When you are only using 1 or 2 pieces a day, stop using the nicotine gum. You should begin using the nicotine gum the day you stop smoking. It is okay if you do not succeed with the attempt to quit and have a cigarette. You can still continue your quit attempt and keep using the product as directed. Just throw away your cigarettes and get back to your quit plan. If your mouth gets sore from chewing the gum, suck hard sugarless candy between pieces of gum to help relieve the soreness. Brush your teeth regularly to reduce mouth irritation. If you wear dentures, contact your doctor or health care professional if the gum sticks to your dental work. If you are a diabetic and you quit smoking, the effects of insulin may be increased and you may need to reduce your insulin dose. Check with your doctor  or health care professional about how you should adjust your insulin dose. What side effects may I notice from receiving this medicine? Side effects that you should report to your doctor or health care professional as soon as possible: -allergic reactions like skin rash, itching or hives, swelling of the face, lips, or tongue -blisters in mouth -breathing problems -changes in hearing -changes in vision -chest pain -cold sweats -confusion -fast, irregular heartbeat -feeling faint or lightheaded, falls -headache -increased saliva -nausea, vomiting -stomach pain -weakness Side effects that usually do not require medical attention (report to your doctor or health care professional if they continue or are bothersome): -diarrhea -dry mouth -hiccups -irritability -nervousness or restlessness -trouble sleeping or vivid dreams This list may not describe all possible side effects. Call your doctor for medical advice about side effects. You may report side effects to FDA at 1-800-FDA-1088. Where should I keep my medicine? Keep out  of the reach of children. Store at room temperature between 15 and 30 degrees C (59 and 86 degrees F). Protect from heat and light. Throw away unused medicine after the expiration date. NOTE: This sheet is a summary. It may not cover all possible information. If you have questions about this medicine, talk to your doctor, pharmacist, or health care provider.    2016, Elsevier/[employer] Standard. (2015-03-13 19:37:14)

## 2015-08-08 LAB — COMPREHENSIVE METABOLIC PANEL
ALK PHOS: 64 U/L (ref 40–115)
ALT: 36 U/L (ref 9–46)
AST: 23 U/L (ref 10–40)
Albumin: 4.8 g/dL (ref 3.6–5.1)
BILIRUBIN TOTAL: 0.6 mg/dL (ref 0.2–1.2)
BUN: 11 mg/dL (ref 7–25)
CO2: 28 mmol/L (ref 20–31)
Calcium: 9.4 mg/dL (ref 8.6–10.3)
Chloride: 104 mmol/L (ref 98–110)
Creat: 1.04 mg/dL (ref 0.60–1.35)
GLUCOSE: 90 mg/dL (ref 65–99)
Potassium: 3.9 mmol/L (ref 3.5–5.3)
Sodium: 142 mmol/L (ref 135–146)
Total Protein: 7.4 g/dL (ref 6.1–8.1)

## 2015-08-08 LAB — TSH: TSH: 1.128 u[IU]/mL (ref 0.350–4.500)

## 2015-08-08 LAB — LIPID PANEL
Cholesterol: 145 mg/dL (ref 125–200)
HDL: 36 mg/dL — ABNORMAL LOW (ref >=40)
LDL Cholesterol: 77 mg/dL (ref <130)
Total CHOL/HDL Ratio: 4 Ratio (ref <=5.0)
Triglycerides: 162 mg/dL — ABNORMAL HIGH (ref <150)
VLDL: 32 mg/dL — ABNORMAL HIGH (ref <30)

## 2016-01-19 DIAGNOSIS — H04123 Dry eye syndrome of bilateral lacrimal glands: Secondary | ICD-10-CM | POA: Diagnosis not present

## 2016-03-04 ENCOUNTER — Ambulatory Visit (INDEPENDENT_AMBULATORY_CARE_PROVIDER_SITE_OTHER): Payer: BLUE CROSS/BLUE SHIELD | Admitting: Physician Assistant

## 2016-03-04 ENCOUNTER — Ambulatory Visit (INDEPENDENT_AMBULATORY_CARE_PROVIDER_SITE_OTHER): Payer: BLUE CROSS/BLUE SHIELD

## 2016-03-04 VITALS — BP 142/84 | HR 88 | Temp 97.8°F | Resp 19 | Ht 68.4 in | Wt 207.6 lb

## 2016-03-04 DIAGNOSIS — S0081XA Abrasion of other part of head, initial encounter: Secondary | ICD-10-CM

## 2016-03-04 DIAGNOSIS — M25551 Pain in right hip: Secondary | ICD-10-CM

## 2016-03-04 DIAGNOSIS — M791 Myalgia: Secondary | ICD-10-CM

## 2016-03-04 DIAGNOSIS — W19XXXA Unspecified fall, initial encounter: Secondary | ICD-10-CM

## 2016-03-04 DIAGNOSIS — K0889 Other specified disorders of teeth and supporting structures: Secondary | ICD-10-CM | POA: Diagnosis not present
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
## 2016-03-04 NOTE — Patient Instructions (Addendum)
Please continue to keep ointment on the wound on your face.  It is okay to take 800 mg Ibuprofen (4 tabs), every eight hours for pain.      IF you received an x-ray today, you will receive an invoice [facility]. Please contact [facility] at [phone] with questions or concerns regarding your invoice.   IF you received labwork today, you will receive an invoice from [facility]. Please contact [facility] at [phone] with questions or concerns regarding your invoice.   Our billing staff will not be able to assist you with questions regarding bills from these companies.  You will be contacted with the lab results as soon as they are available. The fastest way to get your results is to activate your My Chart account. Instructions are located on the last page of this paperwork. If you have not heard from us regarding the results in 2 weeks, please contact this office.

## 2016-06-22 DIAGNOSIS — H04123 Dry eye syndrome of bilateral lacrimal glands: Secondary | ICD-10-CM | POA: Diagnosis not present

## 2016-08-15 DIAGNOSIS — Z0000 Encounter for general adult medical examination without abnormal findings: Secondary | ICD-10-CM | POA: Diagnosis not present

## 2016-08-15 DIAGNOSIS — Z114 Encounter for screening for human immunodeficiency virus [HIV]: Secondary | ICD-10-CM | POA: Diagnosis not present

## 2016-08-15 DIAGNOSIS — R5383 Other fatigue: Secondary | ICD-10-CM | POA: Diagnosis not present

## 2016-08-15 DIAGNOSIS — E559 Vitamin D deficiency, unspecified: Secondary | ICD-10-CM | POA: Diagnosis not present

## 2016-11-22 DIAGNOSIS — H04123 Dry eye syndrome of bilateral lacrimal glands: Secondary | ICD-10-CM | POA: Diagnosis not present

## 2016-11-22 DIAGNOSIS — H40033 Anatomical narrow angle, bilateral: Secondary | ICD-10-CM | POA: Diagnosis not present

## 2017-01-04 DIAGNOSIS — H578 Other specified disorders of eye and adnexa: Secondary | ICD-10-CM | POA: Diagnosis not present

## 2017-01-04 DIAGNOSIS — T1502XA Foreign body in cornea, left eye, initial encounter: Secondary | ICD-10-CM | POA: Diagnosis not present

## 2017-01-04 DIAGNOSIS — S0502XA Injury of conjunctiva and corneal abrasion without foreign body, left eye, initial encounter: Secondary | ICD-10-CM | POA: Diagnosis not present

## 2017-02-21 DIAGNOSIS — H10023 Other mucopurulent conjunctivitis, bilateral: Secondary | ICD-10-CM | POA: Diagnosis not present

## 2017-04-16 DIAGNOSIS — H04123 Dry eye syndrome of bilateral lacrimal glands: Secondary | ICD-10-CM | POA: Diagnosis not present

## 2017-06-24 DIAGNOSIS — S0502XA Injury of conjunctiva and corneal abrasion without foreign body, left eye, initial encounter: Secondary | ICD-10-CM | POA: Diagnosis not present

## 2017-06-24 DIAGNOSIS — H578 Other specified disorders of eye and adnexa: Secondary | ICD-10-CM | POA: Diagnosis not present

## 2017-08-29 ENCOUNTER — Ambulatory Visit (INDEPENDENT_AMBULATORY_CARE_PROVIDER_SITE_OTHER): Payer: BLUE CROSS/BLUE SHIELD | Admitting: Emergency Medicine

## 2017-08-29 ENCOUNTER — Other Ambulatory Visit: Payer: Self-pay

## 2017-08-29 ENCOUNTER — Encounter: Payer: Self-pay | Admitting: Emergency Medicine

## 2017-08-29 VITALS — BP 124/82 | HR 86 | Temp 98.6°F | Resp 16 | Ht 68.5 in | Wt 206.2 lb

## 2017-08-29 DIAGNOSIS — Z0000 Encounter for general adult medical examination without abnormal findings: Secondary | ICD-10-CM | POA: Diagnosis not present

## 2017-08-29 NOTE — Patient Instructions (Addendum)
   IF you received an x-ray today, you will receive an invoice from [facility]. Please contact [facility] at [phone] with questions or concerns regarding your invoice.   IF you received labwork today, you will receive an invoice from [facility]. Please contact [facility] at [phone] with questions or concerns regarding your invoice.   Our billing staff will not be able to assist you with questions regarding bills from these companies.  You will be contacted with the lab results as soon as they are available. The fastest way to get your results is to activate your My Chart account. Instructions are located on the last page of this paperwork. If you have not heard from us regarding the results in 2 weeks, please contact this office.      Health Maintenance, Male A healthy lifestyle and preventive care is important for your health and wellness. Ask your health care provider about what schedule of regular examinations is right for you. What should I know about weight and diet? Eat a Healthy Diet  Eat plenty of vegetables, fruits, whole grains, low-fat dairy products, and lean protein.  Do not eat a lot of foods high in solid fats, added sugars, or salt.  Maintain a Healthy Weight Regular exercise can help you achieve or maintain a healthy weight. You should:  Do at least 150 minutes of exercise each week. The exercise should increase your heart rate and make you sweat (moderate-intensity exercise).  Do strength-training exercises at least twice a week.  Watch Your Levels of Cholesterol and Blood Lipids  Have your blood tested for lipids and cholesterol every 5 years starting at 31 years of age. If you are at high risk for heart disease, you should start having your blood tested when you are 31 years old. You may need to have your cholesterol levels checked more often if: ? Your lipid or cholesterol levels are high. ? You are older than 31 years of age. ? You  are at high risk for heart disease.  What should I know about cancer screening? Many types of cancers can be detected early and may often be prevented. Lung Cancer  You should be screened every year for lung cancer if: ? You are a current smoker who has smoked for at least 30 years. ? You are a former smoker who has quit within the past 15 years.  Talk to your health care provider about your screening options, when you should start screening, and how often you should be screened.  Colorectal Cancer  Routine colorectal cancer screening usually begins at 31 years of age and should be repeated every 5-10 years until you are 31 years old. You may need to be screened more often if early forms of precancerous polyps or small growths are found. Your health care provider may recommend screening at an earlier age if you have risk factors for colon cancer.  Your health care provider may recommend using home test kits to check for hidden blood in the stool.  A small camera at the end of a tube can be used to examine your colon (sigmoidoscopy or colonoscopy). This checks for the earliest forms of colorectal cancer.  Prostate and Testicular Cancer  Depending on your age and overall health, your health care provider may do certain tests to screen for prostate and testicular cancer.  Talk to your health care provider about any symptoms or concerns you have about testicular or prostate cancer.  Skin Cancer  Check your skin   from head to toe regularly.  Tell your health care provider about any new moles or changes in moles, especially if: ? There is a change in a mole's size, shape, or color. ? You have a mole that is larger than a pencil eraser.  Always use sunscreen. Apply sunscreen liberally and repeat throughout the day.  Protect yourself by wearing long sleeves, pants, a wide-brimmed hat, and sunglasses when outside.  What should I know about heart disease, diabetes, and high blood  pressure?  If you are 18-39 years of age, have your blood pressure checked every 3-5 years. If you are 40 years of age or older, have your blood pressure checked every year. You should have your blood pressure measured twice-once when you are at a hospital or clinic, and once when you are not at a hospital or clinic. Record the average of the two measurements. To check your blood pressure when you are not at a hospital or clinic, you can use: ? An automated blood pressure machine at a pharmacy. ? A home blood pressure monitor.  Talk to your health care provider about your target blood pressure.  If you are between 45-79 years old, ask your health care provider if you should take aspirin to prevent heart disease.  Have regular diabetes screenings by checking your fasting blood sugar level. ? If you are at a normal weight and have a low risk for diabetes, have this test once every three years after the age of 45. ? If you are overweight and have a high risk for diabetes, consider being tested at a younger age or more often.  A one-time screening for abdominal aortic aneurysm (AAA) by ultrasound is recommended for men aged 65-75 years who are current or former smokers. What should I know about preventing infection? Hepatitis B If you have a higher risk for hepatitis B, you should be screened for this virus. Talk with your health care provider to find out if you are at risk for hepatitis B infection. Hepatitis C Blood testing is recommended for:  Everyone born from 1945 through 1965.  Anyone with known risk factors for hepatitis C.  Sexually Transmitted Diseases (STDs)  You should be screened each year for STDs including gonorrhea and chlamydia if: ? You are sexually active and are younger than 31 years of age. ? You are older than 31 years of age and your health care provider tells you that you are at risk for this type of infection. ? Your sexual activity has changed since you were last  screened and you are at an increased risk for chlamydia or gonorrhea. Ask your health care provider if you are at risk.  Talk with your health care provider about whether you are at high risk of being infected with HIV. Your health care provider may recommend a prescription medicine to help prevent HIV infection.  What else can I do?  Schedule regular health, dental, and eye exams.  Stay current with your vaccines (immunizations).  Do not use any tobacco products, such as cigarettes, chewing tobacco, and e-cigarettes. If you need help quitting, ask your health care provider.  Limit alcohol intake to no more than 2 drinks per day. One drink equals 12 ounces of beer, 5 ounces of wine, or 1 ounces of hard liquor.  Do not use street drugs.  Do not share needles.  Ask your health care provider for help if you need support or information about quitting drugs.  Tell your health care   provider if you often feel depressed.  Tell your health care provider if you have ever been abused or do not feel safe at home. This information is not intended to replace advice given to you by your health care provider. Make sure you discuss any questions you have with your health care provider. Document Released: 03/14/2008 Document Revised: 05/15/2016 Document Reviewed: 06/20/2015 [employer] Interactive Patient Education  2018 [employer]  

## 2017-08-29 NOTE — Progress Notes (Signed)
Morrison OldFred L Gear 31 y.o.   Chief Complaint  Patient presents with  . Establish Care    with CPE    HISTORY OF PRESENT ILLNESS: This is a 31 y.o. male here to establish care; no complaints or medical concerns.  HPI   Prior to Admission medications   Medication Sig Start Date End Date Taking? Authorizing Provider  buPROPion (WELLBUTRIN SR) 150 MG 12 hr tablet Take 1 tablet for the first 3 days, then 1 tablet twice a day. Patient not taking: Reported on 08/07/2015 08/03/14   Wallis BambergMani, Mario, PA-C    Allergies  Allergen Reactions  . Sulfa Antibiotics     Patient Active Problem List   Diagnosis Date Noted  . Alcohol abuse 08/03/2014  . Fracture of distal phalanx of finger, open 07/13/2012    Past Medical History:  Diagnosis Date  . MRSA infection    on elbow and knee    Past Surgical History:  Procedure Laterality Date  . NAILBED REPAIR  07/13/2012   Procedure: NAILBED REPAIR;  Surgeon: Marlowe ShoresMatthew A Weingold, MD;  Location: [facility];  Service: Orthopedics;  Laterality: Left;  Left index finger    Social History   Socioeconomic History  . Marital status: Single    Spouse name: Not on file  . Number of children: Not on file  . Years of education: Not on file  . Highest education level: Not on file  Social Needs  . Financial resource strain: Not on file  . Food insecurity - worry: Not on file  . Food insecurity - inability: Not on file  . Transportation needs - medical: Not on file  . Transportation needs - non-medical: Not on file  Occupational History  . Not on file  Tobacco Use  . Smoking status: Former Smoker    Packs/day: 1.00    Years: 0.10    Pack years: 0.10    Types: Cigarettes  . Smokeless tobacco: Current User    Types: Chew  Substance and Sexual Activity  . Alcohol use: Yes    Alcohol/week: 14.4 oz    Types: 24 Cans of beer per week  . Drug use: No  . Sexual activity: Yes  Other Topics Concern  . Not on file  Social History  Narrative   Patient is married but separated since 2012, still trying to reconcile, works as a Psychologist, occupationalwelder, 4 night shifts per week, 2 children, 5 y/o (DOB 2010) and 4 y/o (2011), eating is inconsistent, snacks a lot, stays pretty busy with work, does not exercise.    Family History  Problem Relation Age of Onset  . Hypertension Mother   . Obesity Mother   . Diabetes Mother   . Hypertension Father   . Diabetes Sister   . Diabetes Sister   . Alcohol abuse Paternal Grandfather   . Alcohol abuse Paternal Uncle      Review of Systems  Constitutional: Negative.  Negative for chills, fever, malaise/fatigue and weight loss.  HENT: Negative.  Negative for congestion, hearing loss, nosebleeds and sore throat.   Eyes: Negative.  Negative for blurred vision and double vision.  Respiratory: Negative.  Negative for cough, hemoptysis and shortness of breath.   Cardiovascular: Negative.  Negative for chest pain and palpitations.  Gastrointestinal: Negative for abdominal pain, blood in stool, diarrhea, nausea and vomiting.  Genitourinary: Negative for dysuria and hematuria.  Musculoskeletal: Negative for back pain, myalgias and neck pain.  Skin: Negative for rash.  Neurological: Negative.  Negative  for dizziness and headaches.  Endo/Heme/Allergies: Negative.   All other systems reviewed and are negative.  Vitals:   08/29/17 1219  BP: 124/82  Pulse: 86  Resp: 16  Temp: 98.6 F (37 C)  SpO2: 100%     Physical Exam  Constitutional: He is oriented to person, place, and time. He appears well-developed and well-nourished.  HENT:  Head: Normocephalic and atraumatic.  Nose: Nose normal.  Mouth/Throat: Oropharynx is clear and moist.  Eyes: Conjunctivae and EOM are normal. Pupils are equal, round, and reactive to light.  Neck: Normal range of motion. Neck supple. No JVD present. No thyromegaly present.  Cardiovascular: Normal rate, regular rhythm and normal heart sounds.  Pulmonary/Chest: Effort  normal and breath sounds normal.  Abdominal: Soft. Bowel sounds are normal. He exhibits no distension. There is no tenderness.  Musculoskeletal: Normal range of motion.  Lymphadenopathy:    He has no cervical adenopathy.  Neurological: He is alert and oriented to person, place, and time. No sensory deficit. He exhibits normal muscle tone.  Skin: Skin is warm and dry. Capillary refill takes less than 2 seconds. No rash noted.  Psychiatric: He has a normal mood and affect. His behavior is normal.  Vitals reviewed.    ASSESSMENT & PLAN: Levorn was seen today for establish care.  Diagnoses and all orders for this visit:  Routine general medical examination at a health care facility -     CBC with Differential/Platelet -     Comprehensive metabolic panel -     Lipid panel    Patient Instructions       IF you received an x-ray today, you will receive an invoice from [facility]. Please contact [facility] at [phone] with questions or concerns regarding your invoice.   IF you received labwork today, you will receive an invoice from [facility]. Please contact [facility] at [phone] with questions or concerns regarding your invoice.   Our billing staff will not be able to assist you with questions regarding bills from these companies.  You will be contacted with the lab results as soon as they are available. The fastest way to get your results is to activate your My Chart account. Instructions are located on the last page of this paperwork. If you have not heard from [place] regarding the results in 2 weeks, please contact this office.     Health Maintenance, Male A healthy lifestyle and preventive care is important for your health and wellness. Ask your health care provider about what schedule of regular examinations is right for you. What should I know about weight and diet? Eat a Healthy Diet  Eat plenty of vegetables, fruits, whole grains, low-fat dairy  products, and lean protein.  Do not eat a lot of foods high in solid fats, added sugars, or salt.  Maintain a Healthy Weight Regular exercise can help you achieve or maintain a healthy weight. You should:  Do at least 150 minutes of exercise each week. The exercise should increase your heart rate and make you sweat (moderate-intensity exercise).  Do strength-training exercises at least twice a week.  Watch Your Levels of Cholesterol and Blood Lipids  Have your blood tested for lipids and cholesterol every 5 years starting at 31 years of age. If you are at high risk for heart disease, you should start having your blood tested when you are 31 years old. You may need to have your cholesterol levels checked more often if: ? Your lipid or cholesterol levels are  high. ? You are older than 31 years of age. ? You are at high risk for heart disease.  What should I know about cancer screening? Many types of cancers can be detected early and may often be prevented. Lung Cancer  You should be screened every year for lung cancer if: ? You are a current smoker who has smoked for at least 30 years. ? You are a former smoker who has quit within the past 15 years.  Talk to your health care provider about your screening options, when you should start screening, and how often you should be screened.  Colorectal Cancer  Routine colorectal cancer screening usually begins at 31 years of age and should be repeated every 5-10 years until you are 31 years old. You may need to be screened more often if early forms of precancerous polyps or small growths are found. Your health care provider may recommend screening at an earlier age if you have risk factors for colon cancer.  Your health care provider may recommend using home test kits to check for hidden blood in the stool.  A small camera at the end of a tube can be used to examine your colon (sigmoidoscopy or colonoscopy). This checks for the earliest forms  of colorectal cancer.  Prostate and Testicular Cancer  Depending on your age and overall health, your health care provider may do certain tests to screen for prostate and testicular cancer.  Talk to your health care provider about any symptoms or concerns you have about testicular or prostate cancer.  Skin Cancer  Check your skin from head to toe regularly.  Tell your health care provider about any new moles or changes in moles, especially if: ? There is a change in a mole's size, shape, or color. ? You have a mole that is larger than a pencil eraser.  Always use sunscreen. Apply sunscreen liberally and repeat throughout the day.  Protect yourself by wearing long sleeves, pants, a wide-brimmed hat, and sunglasses when outside.  What should I know about heart disease, diabetes, and high blood pressure?  If you are 2518-31 years of age, have your blood pressure checked every 3-5 years. If you are 31 years of age or older, have your blood pressure checked every year. You should have your blood pressure measured twice-once when you are at a hospital or clinic, and once when you are not at a hospital or clinic. Record the average of the two measurements. To check your blood pressure when you are not at a hospital or clinic, you can use: ? An automated blood pressure machine at a pharmacy. ? A home blood pressure monitor.  Talk to your health care provider about your target blood pressure.  If you are between 2645-154 years old, ask your health care provider if you should take aspirin to prevent heart disease.  Have regular diabetes screenings by checking your fasting blood sugar level. ? If you are at a normal weight and have a low risk for diabetes, have this test once every three years after the age of 31. ? If you are overweight and have a high risk for diabetes, consider being tested at a younger age or more often.  A one-time screening for abdominal aortic aneurysm (AAA) by ultrasound is  recommended for men aged 65-75 years who are current or former smokers. What should I know about preventing infection? Hepatitis B If you have a higher risk for hepatitis B, you should be screened for this  virus. Talk with your health care provider to find out if you are at risk for hepatitis B infection. Hepatitis C Blood testing is recommended for:  Everyone born from [birth year] through 1965.  Anyone with known risk factors for hepatitis C.  Sexually Transmitted Diseases (STDs)  You should be screened each year for STDs including gonorrhea and chlamydia if: ? You are sexually active and are younger than 31 years of age. ? You are older than 31 years of age and your health care provider tells you that you are at risk for this type of infection. ? Your sexual activity has changed since you were last screened and you are at an increased risk for chlamydia or gonorrhea. Ask your health care provider if you are at risk.  Talk with your health care provider about whether you are at high risk of being infected with HIV. Your health care provider may recommend a prescription medicine to help prevent HIV infection.  What else can I do?  Schedule regular health, dental, and eye exams.  Stay current with your vaccines (immunizations).  Do not use any tobacco products, such as cigarettes, chewing tobacco, and e-cigarettes. If you need help quitting, ask your health care provider.  Limit alcohol intake to no more than 2 drinks per day. One drink equals 12 ounces of beer, 5 ounces of wine, or 1 ounces of hard liquor.  Do not use street drugs.  Do not share needles.  Ask your health care provider for help if you need support or information about quitting drugs.  Tell your health care provider if you often feel depressed.  Tell your health care provider if you have ever been abused or do not feel safe at home. This information is not intended to replace advice given to you by your health care  provider. Make sure you discuss any questions you have with your health care provider. Document Released: 03/14/2008 Document Revised: 05/15/2016 Document Reviewed: 06/20/2015 Elsevier Interactive Patient Education  2018 Elsevier Inc.      Edwina Barth, MD [facility]

## 2017-08-30 LAB — CBC WITH DIFFERENTIAL/PLATELET
Basophils Absolute: 0 10*3/uL (ref 0.0–0.2)
Basos: 0 %
EOS (ABSOLUTE): 0.2 10*3/uL (ref 0.0–0.4)
Eos: 5 %
HEMOGLOBIN: 16.1 g/dL (ref 13.0–17.7)
Hematocrit: 45 % (ref 37.5–51.0)
Immature Grans (Abs): 0 10*3/uL (ref 0.0–0.1)
Immature Granulocytes: 0 %
LYMPHS ABS: 1.3 10*3/uL (ref 0.7–3.1)
LYMPHS: 25 %
MCH: 29.9 pg (ref 26.6–33.0)
MCHC: 35.8 g/dL — AB (ref 31.5–35.7)
MCV: 84 fL (ref 79–97)
MONOCYTES: 9 %
Monocytes Absolute: 0.4 10*3/uL (ref 0.1–0.9)
Neutrophils Absolute: 3 10*3/uL (ref 1.4–7.0)
Neutrophils: 61 %
PLATELETS: 203 10*3/uL (ref 150–379)
RBC: 5.38 x10E6/uL (ref 4.14–5.80)
RDW: 13.9 % (ref 12.3–15.4)
WBC: 5 10*3/uL (ref 3.4–10.8)

## 2017-08-30 LAB — COMPREHENSIVE METABOLIC PANEL
A/G RATIO: 2 (ref 1.2–2.2)
ALK PHOS: 70 IU/L (ref 39–117)
ALT: 47 IU/L — AB (ref 0–44)
AST: 27 IU/L (ref 0–40)
Albumin: 4.7 g/dL (ref 3.5–5.5)
BILIRUBIN TOTAL: 0.4 mg/dL (ref 0.0–1.2)
BUN/Creatinine Ratio: 9 (ref 9–20)
BUN: 11 mg/dL (ref 6–20)
CALCIUM: 9.8 mg/dL (ref 8.7–10.2)
CHLORIDE: 102 mmol/L (ref 96–106)
CO2: 25 mmol/L (ref 20–29)
Creatinine, Ser: 1.18 mg/dL (ref 0.76–1.27)
GFR calc Af Amer: 94 mL/min/{1.73_m2} (ref >59)
GFR calc non Af Amer: 82 mL/min/{1.73_m2} (ref >59)
Globulin, Total: 2.4 g/dL (ref 1.5–4.5)
Glucose: 104 mg/dL — ABNORMAL HIGH (ref 65–99)
POTASSIUM: 4.9 mmol/L (ref 3.5–5.2)
SODIUM: 141 mmol/L (ref 134–144)
Total Protein: 7.1 g/dL (ref 6.0–8.5)

## 2017-08-30 LAB — LIPID PANEL
CHOL/HDL RATIO: 3.8 ratio (ref 0.0–5.0)
CHOLESTEROL TOTAL: 155 mg/dL (ref 100–199)
HDL: 41 mg/dL (ref >39)
LDL CALC: 99 mg/dL (ref 0–99)
TRIGLYCERIDES: 75 mg/dL (ref 0–149)
VLDL Cholesterol Cal: 15 mg/dL (ref 5–40)

## 2017-09-02 ENCOUNTER — Encounter: Payer: BLUE CROSS/BLUE SHIELD | Admitting: Emergency Medicine

## 2017-10-28 DIAGNOSIS — H40033 Anatomical narrow angle, bilateral: Secondary | ICD-10-CM | POA: Diagnosis not present

## 2017-10-28 DIAGNOSIS — H04123 Dry eye syndrome of bilateral lacrimal glands: Secondary | ICD-10-CM | POA: Diagnosis not present

## 2017-12-24 ENCOUNTER — Emergency Department (HOSPITAL_COMMUNITY): Payer: Self-pay

## 2017-12-24 ENCOUNTER — Emergency Department (HOSPITAL_COMMUNITY)
Admission: EM | Admit: 2017-12-24 | Discharge: 2017-12-24 | Disposition: A | Discharge status: Home / Self Care | Payer: Self-pay | Attending: Emergency Medicine | Admitting: Emergency Medicine

## 2017-12-24 ENCOUNTER — Other Ambulatory Visit: Payer: Self-pay

## 2017-12-24 ENCOUNTER — Encounter (HOSPITAL_COMMUNITY): Payer: Self-pay | Admitting: *Deleted

## 2017-12-24 DIAGNOSIS — R51 Headache: Secondary | ICD-10-CM | POA: Insufficient documentation

## 2017-12-24 DIAGNOSIS — R519 Headache, unspecified: Secondary | ICD-10-CM

## 2017-12-24 DIAGNOSIS — I67841 Reversible cerebrovascular vasoconstriction syndrome: Secondary | ICD-10-CM

## 2017-12-24 DIAGNOSIS — G4482 Headache associated with sexual activity: Secondary | ICD-10-CM

## 2017-12-24 DIAGNOSIS — Z87891 Personal history of nicotine dependence: Secondary | ICD-10-CM | POA: Insufficient documentation

## 2017-12-24 MED ORDER — DIPHENHYDRAMINE HCL 50 MG/ML IJ SOLN
25.0000 mg | Freq: Once | INTRAMUSCULAR | Status: AC
  Administered 2017-12-24: 25 mg via INTRAVENOUS
  Filled 2017-12-24: qty 1

## 2017-12-24 MED ORDER — IOPAMIDOL (ISOVUE-370) INJECTION 76%
INTRAVENOUS | Status: AC
  Administered 2017-12-24: 50 mL via INTRAVENOUS
  Filled 2017-12-24: qty 50

## 2017-12-24 MED ORDER — PROCHLORPERAZINE EDISYLATE 5 MG/ML IJ SOLN
10.0000 mg | Freq: Once | INTRAMUSCULAR | Status: AC
  Administered 2017-12-24: 10 mg via INTRAVENOUS
  Filled 2017-12-24: qty 2

## 2017-12-24 MED ORDER — SODIUM CHLORIDE 0.9 % IV BOLUS
1000.0000 mL | Freq: Once | INTRAVENOUS | Status: AC
  Administered 2017-12-24: 1000 mL via INTRAVENOUS

## 2017-12-24 MED ORDER — GADOBENATE DIMEGLUMINE 529 MG/ML IV SOLN
20.0000 mL | Freq: Once | INTRAVENOUS | Status: AC | PRN
  Administered 2017-12-24: 20 mL via INTRAVENOUS

## 2017-12-24 NOTE — Consult Note (Addendum)
NEUROHOSPITALISTS-CONSULTATION NOTE    Requesting Physician: Adela Lank    Admit date: 12/24/2017    Chief Complaint:  Headache   History obtained from:  Patient     HPI   [place] Hawker. with history of headaches when he was extremely young along with postcoital headaches.  Patient states for the past 2 weeks he is noticed he gets a sudden onset headache that occurs, usually brought upon after coitus. He describes the headache as being throbbing, located in the left temporal and occipital region, ibuprofen does help a little with a headache but not significantly.  However during the daytime he does not have a headache.  He denies any history of migraines. However, this time headache lasted since last night and more severe than normal. He states that light does increase the headache however sound does not.  Denies that this is worse headache of his life.  He did receive a migraine cocktail while in the ED which brought his headache down to a 5/10.  Currently he is resting comfortably and does not appear to be in pain.  He denies any drug use other than chewing tobacco.  Patient denies the use of cocaine, heroin, methamphetamines, THC and or any other vasoconstricting medications. He does admit to drinking " Monsters" frequently. 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       Past Medical History    Past Medical History:  Diagnosis Date  . MRSA infection    on elbow and knee     Past Surgical History   Past Surgical History:  Procedure Laterality Date  . NAILBED REPAIR  07/13/2012   Procedure: NAILBED REPAIR;  Surgeon: Marlowe Shores, MD;  Location: [facility];  Service: Orthopedics;  Laterality: Left;  Left index finger     Family History   Family History  Problem Relation Age of Onset  . Hypertension Mother   . Obesity Mother   . Diabetes Mother   . Hypertension Father   .  Diabetes Sister   . Diabetes Sister   . Alcohol abuse Paternal Grandfather   . Alcohol abuse Paternal Uncle     Social History  Social History:  reports that he has quit smoking. His smoking use included cigarettes. He has a 0.10 pack-year smoking history. His smokeless tobacco use includes chew. He reports that he drinks about 14.4 oz of alcohol per week. He reports that he does not use drugs.   Allergies  Allergies:  Allergies  Allergen Reactions  . Sulfa Antibiotics      Medications Prior to Admission    No current facility-administered medications for this encounter.    No current outpatient medications on file.      ROS  History obtained from the patient General ROS: negative for - chills, fatigue, fever, night sweats, weight gain or weight loss Psychological ROS: negative for - behavioral disorder, hallucinations, memory difficulties, mood swings or suicidal ideation Ophthalmic ROS: negative for - blurry vision, double vision, eye pain or loss of vision ENT ROS: negative for - epistaxis, nasal discharge, oral lesions, sore throat, tinnitus or vertigo Allergy and Immunology ROS: negative for - hives or itchy/watery eyes Hematological and Lymphatic ROS: negative for - bleeding problems, bruising or swollen lymph nodes Endocrine ROS: negative for - galactorrhea, hair pattern changes, polydipsia/polyuria or temperature intolerance Respiratory ROS: negative for - cough, hemoptysis, shortness of breath or wheezing Cardiovascular ROS: negative for - chest  pain, dyspnea on exertion, edema or irregular heartbeat Gastrointestinal ROS: negative for - abdominal pain, diarrhea, hematemesis, nausea/vomiting or stool incontinence Genito-Urinary ROS: negative for - dysuria, hematuria, incontinence or urinary frequency/urgency Musculoskeletal ROS: negative for - joint swelling or muscular weakness Neurological ROS: as noted in HPI Dermatological ROS: negative for rash and skin lesion  changes   Physical Examination          Vitals:    12/21/17 1445 12/21/17 1515 12/21/17 1545 12/21/17 1600  BP: 103/68 (!) 93/58 118/76 (!) 120/101  Pulse: (!) 58 (!) 56 (!) 59 (!) 59  Resp: 17 15 16 14   Temp:          TempSrc:          SpO2: 98% 99% 100% 100%  Weight:          Height:            HEENT-  Normocephalic,  Cardiovascular - Regular rate and rhythm  Respiratory -  Non-labored breathing, No wheezing. Abdomen - soft and non-tender, BS normal Extremities- no edema or cyanosis Skin-warm and dry   Neurological Examination  Blood pressure 135/72, pulse 76, temperature 97.9 F (36.6 C), temperature source Oral, resp. rate 16, SpO2 97 %.  HEENT-  Normocephalic, no lesions, without obvious abnormality.  Normal external eye and conjunctiva.   Cardiovascular- S1-S2 audible, pulses palpable throughout   Lungs-no rhonchi or wheezing noted, no excessive working breathing.  Saturations within normal limits Abdomen- All 4 quadrants palpated and nontender Extremities- Warm, dry and intact Musculoskeletal-no joint tenderness, deformity or swelling Skin-warm and dry, no hyperpigmentation, vitiligo, or suspicious lesions  Neurological Examination Mental Status: Alert, oriented, thought content appropriate.  Speech fluent without evidence of aphasia.  Able to follow 3 step commands without difficulty. Cranial Nerves: II:  Visual fields grossly normal,  III,IV, VI: ptosis not present, extra-ocular motions intact bilaterally, pupils equal, round, reactive to light and accommodation V,VII: smile symmetric, facial light touch sensation normal bilaterally VIII: hearing normal bilaterally IX,X: uvula rises symmetrically XI: bilateral shoulder shrug XII: midline tongue extension Motor: Right : Upper extremity   5/5    Left:     Upper extremity   5/5  Lower extremity   5/5     Lower extremity   5/5 Tone and bulk:normal tone throughout; no atrophy noted Sensory: Pinprick and light  touch intact throughout, bilaterally Deep Tendon Reflexes: 2+ and symmetric throughout Plantars: Right: downgoing   Left: downgoing Cerebellar: normal finger-to-nose, normal rapid alternating movements and normal heel-to-shin test Gait: Not tested   Laboratory Results  No results for input(s): NA, K, CL, CO2, GLUCOSE, BUN, CREATININE, CALCIUM, MG, PHOS in the last 168 hours.  No results for input(s): CHOL, TRIG, HDL, CHOLHDL, VLDL, LDLCALC in the last 168 hours.   No results for input(s): GLUCAP in the last 168 hours.   Imaging Results   Imaging: Ct Angio Head W Or Wo Contrast  Result Date: 12/24/2017  IMPRESSION: Normal head CT precontrast.  No sign of hemorrhage. Normal CT angiography of the neck vessels. Intracranial CT angiography shows diffuse abnormality of the more distal branch vessels suggesting reversible vaso constrictive syndrome. Many of the branch vessels show narrowing and irregularity and there is dilatation a distal left M3 M4 branch. I did consider the possibility if this dilated vessel could be venous, as might be seen with a direct fistula, but there does not appear to be as much shunting as I would expect in that situation. Additionally, a  direct fistula in this location would be distinctly unusual. To completely exclude that possibility, catheter angiography would be necessary. Electronically Signed   By: Paulina FusiMark  Shogry M.D.   On: 12/24/2017 11:15   Ct Angio Neck W And/or Wo Contrast  Result Date: 12/24/2017  IMPRESSION: Normal head CT precontrast.  No sign of hemorrhage. Normal CT angiography of the neck vessels. Intracranial CT angiography shows diffuse abnormality of the more distal branch vessels suggesting reversible vaso constrictive syndrome. Many of the branch vessels show narrowing and irregularity and there is dilatation a distal left M3 M4 branch. I did consider the possibility if this dilated vessel could be venous, as might be seen with a direct fistula, but  there does not appear to be as much shunting as I would expect in that situation. Additionally, a direct fistula in this location would be distinctly unusual. To completely exclude that possibility, catheter angiography would be necessary. Electronically Signed   By: Paulina FusiMark  Shogry M.D.   On: 12/24/2017 11:15     MRI Brain   EXAM: MRI HEAD WITHOUT AND WITH CONTRAST  TECHNIQUE: Multiplanar, multiecho pulse sequences of the brain and surrounding structures were obtained without and with intravenous contrast.  CONTRAST:  20mL MULTIHANCE GADOBENATE DIMEGLUMINE 529 MG/ML IV SOLN  COMPARISON:  Head and neck CTA 12/24/2017  FINDINGS: Brain: There is no evidence of acute infarct, intracranial hemorrhage, mass, midline shift, or extra-axial fluid collection. The ventricles and sulci are normal. The brain is normal in signal. No abnormal enhancement is identified.  Vascular: Major intracranial vascular flow voids are preserved. A mildly dilated distal left M3 branch is again noted, more fully evaluated on earlier CTA.  Skull and upper cervical spine: Unremarkable bone marrow signal.  Sinuses/Orbits: Unremarkable orbits. Trace mucosal thickening in the maxillary sinuses. Trace left mastoid effusion.  Other: None.  IMPRESSION: Unremarkable appearance of the brain.  No infarct.   Electronically Signed   By: Sebastian AcheAllen  Grady M.D.   On: 12/24/2017 17:26   NEUROHOSPITALIST ADDENDUM Seen and examined the patient today. Agree with history and exam as above, changes have been made to above.  Recommendations as below  ASSESSMENT AND PLAN  32 year old male presenting with 2 weeks of chronic headaches mainly at night.  Patient does have history of post coital headaches however this was different than usual.  Headaches mostly located on the left temporal and left occipital region.  CTA was obtained showing concern for reversible vasoconstriction syndrome ( appears to be mild) and  also dilated left M3 vessel.  MRI obtained showing no acute intracranial abnormalities - no old or acute infarcts, SWI imaging negative for vascular lesion.  Patient does admit to drinking multiple caffeinated drinks a day including "monster caffeinated drinks". UDS ordered, however results still pending.     Possible RCVS - Follow-up with outpatient neurologist with Dr Lucia GaskinsAhern for chronic headaches -- Avoid drinking caffeinated drinks, "Monster" stimulants -Follow-up CTA in 3-4 weeks to see if vasconstriction still present on CTA.  -Try to keep NSAID use to a minimum avoid analgesic overuse rebound headaches    Sushanth Aroor MD [facility] [phone]  If 7pm to 7am, please call on call as listed on AMION.

## 2017-12-24 NOTE — Discharge Instructions (Addendum)
Follow-up with the neurologist listed for evaluation of headaches.  You will likely need further testing in 3-4 weeks.  Call them tomorrow morning for the appointment.  Avoid drinking caffeinated products or using other forms of caffeine.  Pain, use ibuprofen 400 mg 3 times a day if needed.

## 2017-12-24 NOTE — ED Provider Notes (Signed)
Patient has been seen by the neuro hospitalist who has evaluated the patient's MRI, and his made discharge recommendations, as follows:   Possible RCVS - Follow-up with outpatient neurologist with Dr Lucia GaskinsAhern for chronic headaches -- Avoid drinking caffeinated drinks, "Monster" stimulants -Follow-up CTA in 3-4 weeks to see if vasconstriction still present on CTA.  -Try to keep NSAID use to a minimum avoid analgesic overuse rebound headaches   At this time the patient is calm and comfortable.  Vital signs at 1600 hrs. noted and are normal except the pulse is low at 54.  Findings discussed with patient and his family member, all questions were answered.       Mancel BaleWentz, Leronda Lewers, MD 12/24/17 1901

## 2017-12-24 NOTE — ED Notes (Signed)
Patient transported to MRI 

## 2017-12-24 NOTE — ED Provider Notes (Signed)
[facility] EMERGENCY DEPARTMENT Provider Note   CSN: [id number] Arrival date & time: 12/24/17  0225     History   Chief Complaint Chief Complaint  Patient presents with  . Headache    HPI Calvin Sandoval. is a 32 y.o. male.  32 yo M with a chief complaint of a headache.  This happens when he orgasms.  He has had this ongoing for the past couple weeks.  Had an episode occurred today.  Describes it as left-sided throbbing radiates sometimes to his neck.  Nothing seems to make it better or worse.  Has some nausea but denies vomiting.  Denies head injury denies fever denies neck pain.  Denies unilateral numbness or weakness denies difficulty with speech or swallowing.  He has not tried anything for this.  The history is provided by the patient.  Headache   This is a new problem. The current episode started more than 1 week ago. The problem occurs constantly. The problem has not changed since onset.The headache is associated with nothing. The pain is located in the left unilateral region. The pain is at a severity of 9/10. The pain is moderate. The pain does not radiate. Pertinent negatives include no fever, no palpitations, no shortness of breath and no vomiting. He has tried nothing for the symptoms. The treatment provided no relief.    Past Medical History:  Diagnosis Date  . MRSA infection    on elbow and knee    Patient Active Problem List   Diagnosis Date Noted  . Alcohol abuse 08/03/2014  . Fracture of distal phalanx of finger, open 07/13/2012    Past Surgical History:  Procedure Laterality Date  . NAILBED REPAIR  07/13/2012   Procedure: NAILBED REPAIR;  Surgeon: Marlowe Shores, MD;  Location: [facility];  Service: Orthopedics;  Laterality: Left;  Left index finger        Home Medications    Prior to Admission medications   Not on File    Family History Family History  Problem Relation Age of Onset  . Hypertension  Mother   . Obesity Mother   . Diabetes Mother   . Hypertension Father   . Diabetes Sister   . Diabetes Sister   . Alcohol abuse Paternal Grandfather   . Alcohol abuse Paternal Uncle     Social History Social History   Tobacco Use  . Smoking status: Former Smoker    Packs/day: 1.00    Years: 0.10    Pack years: 0.10    Types: Cigarettes  . Smokeless tobacco: Current User    Types: Chew  Substance Use Topics  . Alcohol use: Yes    Alcohol/week: 14.4 oz    Types: 24 Cans of beer per week  . Drug use: No     Allergies   Sulfa antibiotics   Review of Systems Review of Systems  Constitutional: Negative for chills and fever.  HENT: Negative for congestion and facial swelling.   Eyes: Negative for discharge and visual disturbance.  Respiratory: Negative for shortness of breath.   Cardiovascular: Negative for chest pain and palpitations.  Gastrointestinal: Negative for abdominal pain, diarrhea and vomiting.  Musculoskeletal: Negative for arthralgias and myalgias.  Skin: Negative for color change and rash.  Neurological: Positive for headaches. Negative for tremors and syncope.  Psychiatric/Behavioral: Negative for confusion and dysphoric mood.     Physical Exam Updated Vital Signs BP 140/75   Pulse 92   Temp 97.9  F (36.6 C) (Oral)   Resp 16   SpO2 95%   Physical Exam  Constitutional: He is oriented to person, place, and time. He appears well-developed and well-nourished.  HENT:  Head: Normocephalic and atraumatic.  Eyes: Pupils are equal, round, and reactive to light. EOM are normal.  Neck: Normal range of motion. Neck supple. No JVD present.  Cardiovascular: Normal rate and regular rhythm. Exam reveals no gallop and no friction rub.  No murmur heard. Pulmonary/Chest: No respiratory distress. He has no wheezes.  Abdominal: He exhibits no distension. There is no rebound and no guarding.  Musculoskeletal: Normal range of motion.  Neurological: He is alert and  oriented to person, place, and time. He has normal strength. No cranial nerve deficit or sensory deficit. He displays a negative Romberg sign. Coordination and gait normal. GCS eye subscore is 4. GCS verbal subscore is 5. GCS motor subscore is 6.  Benign neuro exam  Skin: No rash noted. No pallor.  Psychiatric: He has a normal mood and affect. His behavior is normal.  Nursing note and vitals reviewed.    ED Treatments / Results  Labs (all labs ordered are listed, but only abnormal results are displayed) Labs Reviewed - No data to display  EKG None  Radiology Ct Angio Head W Or Wo Contrast  Result Date: 12/24/2017 CLINICAL DATA:  Intermittent headache over the last 2 weeks localized to the right temporal region and right occipital region. Worse after exertion. EXAM: CT ANGIOGRAPHY HEAD AND NECK TECHNIQUE: Multidetector CT imaging of the head and neck was performed using the standard protocol during bolus administration of intravenous contrast. Multiplanar CT image reconstructions and MIPs were obtained to evaluate the vascular anatomy. Carotid stenosis measurements (when applicable) are obtained utilizing NASCET criteria, using the distal internal carotid diameter as the denominator. CONTRAST:  50 cc Isovue 370 COMPARISON:  None. FINDINGS: CT HEAD FINDINGS Brain: The brain shows a normal appearance without evidence of malformation, atrophy, old or acute small or large vessel infarction, mass lesion, hemorrhage, hydrocephalus or extra-axial collection. Vascular: No hyperdense vessel. No evidence of atherosclerotic calcification. Skull: Normal.  No traumatic finding.  No focal bone lesion. Sinuses/Orbits: Sinuses are clear. Orbits appear normal. Mastoids are clear. Other: None significant CTA NECK FINDINGS Aortic arch: Normal Right carotid system: Normal Left carotid system: Normal Vertebral arteries: Normal Skeleton: Normal Other neck: Normal Upper chest: Normal Review of the MIP images confirms the  above findings CTA HEAD FINDINGS Anterior circulation: Both internal carotid arteries widely patent through the skull base and siphon regions. The anterior and middle cerebral vessels are patent without evidence of aneurysm. More distal branch vessels show diffuse abnormality. On the right, I think there is some narrowing and irregularity. On the left, there is a somewhat dilated M3 to M4 branch. 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 Posterior circulation: Both vertebral arteries are widely patent to the basilar. No basilar stenosis. Posterior circulation branch vessels are patent, but also shows some slight irregularity. Venous sinuses: Patent and normal. Anatomic variants: None significant Delayed phase: No abnormal parenchymal enhancement. Review of the MIP images confirms the above findings IMPRESSION: Normal head CT precontrast.  No sign of hemorrhage. Normal CT angiography of the neck vessels. Intracranial CT angiography shows diffuse abnormality of the more distal branch vessels suggesting reversible vaso constrictive syndrome. Many of the branch vessels show narrowing and irregularity and there is dilatation a distal left M3 M4 branch. I  did consider the possibility if this dilated vessel could be venous, as might be seen with a direct fistula, but there does not appear to be as much shunting as I would expect in that situation. Additionally, a direct fistula in this location would be distinctly unusual. To completely exclude that possibility, catheter angiography would be necessary. Electronically Signed   By: Paulina Fusi M.D.   On: 12/24/2017 11:15   Ct Angio Neck W And/or Wo Contrast  Result Date: 12/24/2017 CLINICAL DATA:  Intermittent headache over the last 2 weeks localized to the right temporal region and right occipital region. Worse after exertion. EXAM: CT ANGIOGRAPHY HEAD AND NECK TECHNIQUE: Multidetector CT  imaging of the head and neck was performed using the standard protocol during bolus administration of intravenous contrast. Multiplanar CT image reconstructions and MIPs were obtained to evaluate the vascular anatomy. Carotid stenosis measurements (when applicable) are obtained utilizing NASCET criteria, using the distal internal carotid diameter as the denominator. CONTRAST:  50 cc Isovue 370 COMPARISON:  None. FINDINGS: CT HEAD FINDINGS Brain: The brain shows a normal appearance without evidence of malformation, atrophy, old or acute small or large vessel infarction, mass lesion, hemorrhage, hydrocephalus or extra-axial collection. Vascular: No hyperdense vessel. No evidence of atherosclerotic calcification. Skull: Normal.  No traumatic finding.  No focal bone lesion. Sinuses/Orbits: Sinuses are clear. Orbits appear normal. Mastoids are clear. Other: None significant CTA NECK FINDINGS Aortic arch: Normal Right carotid system: Normal Left carotid system: Normal Vertebral arteries: Normal Skeleton: Normal Other neck: Normal Upper chest: Normal Review of the MIP images confirms the above findings CTA HEAD FINDINGS Anterior circulation: Both internal carotid arteries widely patent through the skull base and siphon regions. The anterior and middle cerebral vessels are patent without evidence of aneurysm. More distal branch vessels show diffuse abnormality. On the right, I think there is some narrowing and irregularity. On the left, there is a somewhat dilated M3 to M4 branch. 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 Posterior circulation: Both vertebral arteries are widely patent to the basilar. No basilar stenosis. Posterior circulation branch vessels are patent, but also shows some slight irregularity. Venous sinuses: Patent and normal. Anatomic variants: None significant Delayed phase: No abnormal parenchymal enhancement. Review of the MIP  images confirms the above findings IMPRESSION: Normal head CT precontrast.  No sign of hemorrhage. Normal CT angiography of the neck vessels. Intracranial CT angiography shows diffuse abnormality of the more distal branch vessels suggesting reversible vaso constrictive syndrome. Many of the branch vessels show narrowing and irregularity and there is dilatation a distal left M3 M4 branch. I did consider the possibility if this dilated vessel could be venous, as might be seen with a direct fistula, but there does not appear to be as much shunting as I would expect in that situation. Additionally, a direct fistula in this location would be distinctly unusual. To completely exclude that possibility, catheter angiography would be necessary. Electronically Signed   By: Paulina Fusi M.D.   On: 12/24/2017 11:15   Mr Laqueta Jean WU Contrast  Result Date: 12/24/2017 CLINICAL DATA:  Left temporal and occipital headaches occurring nightly for the past 2 weeks. History of postcoital headaches. EXAM: MRI HEAD WITHOUT AND WITH CONTRAST TECHNIQUE: Multiplanar, multiecho pulse sequences of the brain and surrounding structures were obtained without and with intravenous contrast. CONTRAST:  20mL MULTIHANCE GADOBENATE DIMEGLUMINE 529 MG/ML IV SOLN COMPARISON:  Head and neck CTA 12/24/2017 FINDINGS: Brain:  There is no evidence of acute infarct, intracranial hemorrhage, mass, midline shift, or extra-axial fluid collection. The ventricles and sulci are normal. The brain is normal in signal. No abnormal enhancement is identified. Vascular: Major intracranial vascular flow voids are preserved. A mildly dilated distal left M3 branch is again noted, more fully evaluated on earlier CTA. Skull and upper cervical spine: Unremarkable bone marrow signal. Sinuses/Orbits: Unremarkable orbits. Trace mucosal thickening in the maxillary sinuses. Trace left mastoid effusion. Other: None. IMPRESSION: Unremarkable appearance of the brain.  No infarct.  Electronically Signed   By: Sebastian AcheAllen  Grady M.D.   On: 12/24/2017 17:26    Procedures Procedures (including critical care time)  Medications Ordered in ED Medications  sodium chloride 0.9 % bolus 1,000 mL (0 mLs Intravenous Stopped 12/24/17 1227)  prochlorperazine (COMPAZINE) injection 10 mg (10 mg Intravenous Given 12/24/17 0930)  diphenhydrAMINE (BENADRYL) injection 25 mg (25 mg Intravenous Given 12/24/17 0930)  iopamidol (ISOVUE-370) 76 % injection (50 mLs Intravenous Contrast Given 12/24/17 1018)  gadobenate dimeglumine (MULTIHANCE) injection 20 mL (20 mLs Intravenous Contrast Given 12/24/17 1704)     Initial Impression / Assessment and Plan / ED Course  I have reviewed the triage vital signs and the nursing notes.  Pertinent labs & imaging results that were available during my care of the patient were reviewed by me and considered in my medical decision making (see chart for details).     32 yo M with a chief complaint of a left-sided headache.  This is been going on for the past couple weeks.  He is well-appearing and nontoxic has a normal neurologic exam.  Due to this being an orgasmic headache L obtain a CT Angie of the head and neck to evaluate for aneurysm.   The patient is feeling better post headache cocktail.  CT scan is concerning for reversible cerebral vasoconstrictive syndrome however there is also some concern for fistula I discussed the case with neurology who will come and evaluate the patient.  Recommended MRI.  Care turned over to Dr. Effie ShyWentz, please see his note for further details of care.   The patients results and plan were reviewed and discussed.   Any x-rays performed were independently reviewed by myself.   Differential diagnosis were considered with the presenting HPI.  Medications  sodium chloride 0.9 % bolus 1,000 mL (0 mLs Intravenous Stopped 12/24/17 1227)  prochlorperazine (COMPAZINE) injection 10 mg (10 mg Intravenous Given 12/24/17 0930)  diphenhydrAMINE  (BENADRYL) injection 25 mg (25 mg Intravenous Given 12/24/17 0930)  iopamidol (ISOVUE-370) 76 % injection (50 mLs Intravenous Contrast Given 12/24/17 1018)  gadobenate dimeglumine (MULTIHANCE) injection 20 mL (20 mLs Intravenous Contrast Given 12/24/17 1704)    Vitals:   12/24/17 1545 12/24/17 1600 12/24/17 1900 12/24/17 1915  BP: 133/82 128/76 (!) 142/75 140/75  Pulse:  (!) 54 77 92  Resp:    16  Temp:      TempSrc:      SpO2:  98% 96% 95%    Final diagnoses:  Nonintractable headache, unspecified chronicity pattern, unspecified headache type      Final Clinical Impressions(s) / ED Diagnoses   Final diagnoses:  Nonintractable headache, unspecified chronicity pattern, unspecified headache type    ED Discharge Orders    None       Melene PlanFloyd, Abdalrahman Clementson, DO 12/25/17 [id number]

## 2017-12-24 NOTE — ED Notes (Signed)
PT states understanding of care given, follow up care. PT ambulated from ED to car with a steady gait.  

## 2017-12-24 NOTE — ED Triage Notes (Addendum)
Pt has been having intermittent headaches for the past two weeks. Pain localized to R temple and posterior head; worsens after having sex. Reports photophobia and nausea. Has tried ibuprofen and Excedrin migraine without relief

## 2017-12-31 ENCOUNTER — Ambulatory Visit (INDEPENDENT_AMBULATORY_CARE_PROVIDER_SITE_OTHER): Payer: BLUE CROSS/BLUE SHIELD | Admitting: Medical

## 2017-12-31 ENCOUNTER — Encounter: Payer: Self-pay | Admitting: Medical

## 2017-12-31 ENCOUNTER — Telehealth: Payer: Self-pay | Admitting: Medical

## 2017-12-31 VITALS — BP 147/92 | HR 75 | Temp 97.7°F | Resp 16 | Ht 68.0 in | Wt 221.8 lb

## 2017-12-31 DIAGNOSIS — R519 Headache, unspecified: Secondary | ICD-10-CM

## 2017-12-31 DIAGNOSIS — R51 Headache: Secondary | ICD-10-CM

## 2017-12-31 DIAGNOSIS — I10 Essential (primary) hypertension: Secondary | ICD-10-CM

## 2017-12-31 LAB — COMPREHENSIVE METABOLIC PANEL
ALK PHOS: 56 U/L (ref 39–117)
ALT: 53 U/L (ref 0–53)
AST: 31 U/L (ref 0–37)
Albumin: 4.6 g/dL (ref 3.5–5.2)
BUN: 12 mg/dL (ref 6–23)
CALCIUM: 10.2 mg/dL (ref 8.4–10.5)
CO2: 32 meq/L (ref 19–32)
CREATININE: 1.05 mg/dL (ref 0.40–1.50)
Chloride: 103 mEq/L (ref 96–112)
GFR: 87.21 mL/min (ref >60.00)
GLUCOSE: 100 mg/dL — AB (ref 70–99)
Potassium: 5.2 mEq/L — ABNORMAL HIGH (ref 3.5–5.1)
Sodium: 141 mEq/L (ref 135–145)
TOTAL PROTEIN: 7.7 g/dL (ref 6.0–8.3)
Total Bilirubin: 0.8 mg/dL (ref 0.2–1.2)

## 2017-12-31 MED ORDER — LEVOCETIRIZINE DIHYDROCHLORIDE 5 MG PO TABS: 5.0000 mg | ORAL_TABLET | Freq: Every evening | ORAL | 1 refills | Status: AC

## 2017-12-31 MED ORDER — FLUTICASONE PROPIONATE 50 MCG/ACT NA SUSP: 2.0000 | Freq: Every day | NASAL | 1 refills | Status: AC

## 2017-12-31 NOTE — Telephone Encounter (Signed)
[name],  I believe the emergency department may have contact you about this patient he has had some moderate high blood pressure readings in the 140-155 systolic range and diastolics have been in the 90 range since he left the emergency department.  He has had 2 mild headaches over the last week.  No reported associated neurologic signs or symptoms with the mild to moderate headaches.  He was told in the past that his blood pressure was high when if he sees a MD for acute complaints.  I was considering giving him amlodipine 5 mg daily.  Under normal circumstances I would not hesitate.  I am a little bit unclear if there is any special considerations for the CT findings regarding reversible vasoconstrictive syndrome?Calvin Sandoval.  Any thoughts on this and choice of blood pressure medications?.  I have put in referral for patient to see you as that was recommended by the emergency department.  Below is a summary of patient's CT.  Thanks for your help  [name], PA-C   Intracranial CT angiography shows diffuse abnormality of the more distal branch vessels suggesting reversible vaso constrictive syndrome. Many of the branch vessels show narrowing and irregularity and there is dilatation a distal left M3 M4 branch. I did consider the possibility if this dilated vessel could be venous, as might be seen with a direct fistula, but there does not appear to be as much shunting as I would expect in that situation. Additionally, a direct fistula in this location would be distinctly unusual. To completely exclude that possibility, catheter angiography would be necessary.

## 2017-12-31 NOTE — Progress Notes (Signed)
Subjective:    Patient ID: Calvin Cola., male    DOB: [birth year], 32 y.o.   MRN: [id number]  HPI  Pt in for first time.  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 Pt drinks alcohol on weekends. About 12 pack on weekends. Pt does not smoke, single but has girlfriend.   Pt seen ED.   "45 yo M with a chief complaint of a headache.  This happens when he orgasms.  He has had this ongoing for the past couple weeks.  Had an episode occurred today.  Describes it as left-sided throbbing radiates sometimes to his neck.  Nothing seems to make it better or worse.  Has some nausea but denies vomiting.  Denies head injury denies fever denies neck pain.  Denies unilateral numbness or weakness denies difficulty with speech or swallowing.  He has not tried anything for this.  The history is provided by the patient.   Headache   This is a new problem. The current episode started more than 1 week ago. The problem occurs constantly. The problem has not changed since onset.The headache is associated with nothing. The pain is located in the left unilateral region. The pain is at a severity of 9/10. The pain is moderate. The pain does not radiate. Pertinent negatives include no fever, no palpitations, no shortness of breath and no vomiting. He has tried nothing for the symptoms. The treatment provided no relief."  Above portion in " is from ED.  Pt was having HA every day for one week prior to going to ED. Since ED he reports only 2 days with HA. Both days headache was not very intense. No light sensitivity, no sound sensitive. But week of ha preceding ED evaluation he felt mild nausea. This has also decreased.   Pt has been using ibuprofen minimally. He has stopped all caffeine beverages.   Pt bp has been high in the past. 140/75 at ED when he left. But other providers for acute visits they would tell pt mild  high.    Pt ct of head showed. CT scan is concerning for reversible cerebral vasoconstrictive syndrome however there is also some concern for fistula I discussed the case with neurology who will come and evaluate the patient.  Recommended MRI.  Care turned over to Dr. Effie Shy, please see his note for further details of care.   Pt told to follow with Dr. Lucia Gaskins neurologist.     Review of Systems  Constitutional: Negative for chills, fatigue and fever.  HENT: Positive for congestion and sneezing. Negative for rhinorrhea, sinus pressure and sinus pain.   Respiratory: Negative for chest tightness, shortness of breath and stridor.   Gastrointestinal: Negative for abdominal distention, abdominal pain, diarrhea and nausea.  Musculoskeletal: Negative for back pain.  Skin: Negative for rash.  Neurological: Positive for headaches.       No ha presently. Mild ha this morning Last for about 5  Hours. Level 5/10 ha.  Hematological: Negative for adenopathy. Does not bruise/bleed easily.  Psychiatric/Behavioral: Negative for behavioral problems, confusion, self-injury and suicidal ideas. The patient is not nervous/anxious and is not hyperactive.     Past Medical History:  Diagnosis Date  . MRSA infection    on elbow and knee     Social History   Socioeconomic History  . Marital status: Single    Spouse name: Not on  file  . Number of children: Not on file  . Years of education: Not on file  . Highest education level: Not on file  Occupational History  . Not on file  Social Needs  . Financial resource strain: Not on file  . Food insecurity:    Worry: Not on file    Inability: Not on file  . Transportation needs:    Medical: Not on file    Non-medical: Not on file  Tobacco Use  . Smoking status: Former Smoker    Packs/day: 1.00    Years: 0.10    Pack years: 0.10    Types: Cigarettes  . Smokeless tobacco: Current User    Types: Chew  Substance and Sexual Activity  . Alcohol use:  Yes    Alcohol/week: 14.4 oz    Types: 24 Cans of beer per week  . Drug use: No  . Sexual activity: Yes  Lifestyle  . Physical activity:    Days per week: Not on file    Minutes per session: Not on file  . Stress: Not on file  Relationships  . Social connections:    Talks on phone: Not on file    Gets together: Not on file    Attends religious service: Not on file    Active member of club or organization: Not on file    Attends meetings of clubs or organizations: Not on file    Relationship status: Not on file  . Intimate partner violence:    Fear of current or ex partner: Not on file    Emotionally abused: Not on file    Physically abused: Not on file    Forced sexual activity: Not on file  Other Topics Concern  . Not on file  Social History Narrative   Patient is married but separated since 2012, still trying to reconcile, works as a Psychologist, occupational, 4 night shifts per week, 2 children, 5 y/o (DOB 2010) and 4 y/o (2011), eating is inconsistent, snacks a lot, stays pretty busy with work, does not exercise.    Past Surgical History:  Procedure Laterality Date  . NAILBED REPAIR  07/13/2012   Procedure: NAILBED REPAIR;  Surgeon: Marlowe Shores, MD;  Location: [facility];  Service: Orthopedics;  Laterality: Left;  Left index finger    Family History  Problem Relation Age of Onset  . Hypertension Mother   . Obesity Mother   . Diabetes Mother   . Hypertension Father   . Diabetes Sister   . Diabetes Sister   . Alcohol abuse Paternal Grandfather   . Alcohol abuse Paternal Uncle     Allergies  Allergen Reactions  . Sulfa Antibiotics     No current outpatient medications on file prior to visit.   No current facility-administered medications on file prior to visit.     BP (!) 147/92   Pulse 75   Temp 97.7 F (36.5 C) (Oral)   Resp 16   Ht 5\' 8"  (1.727 m)   Wt 221 lb 12.8 oz (100.6 kg)   SpO2 99%   BMI 33.72 kg/m       Objective:   Physical  Exam  General Mental Status- Alert. General Appearance- Not in acute distress.   Skin General: Color- Normal Color. Moisture- Normal Moisture.  Neck Carotid Arteries- Normal color. Moisture- Normal Moisture. No carotid bruits. No JVD.  Chest and Lung Exam Auscultation: Breath Sounds:-Normal.  Cardiovascular Auscultation:Rythm- Regular. Murmurs & Other Heart Sounds:Auscultation of the  heart reveals- No Murmurs.  Abdomen Inspection:-Inspeection Normal. Palpation/Percussion:Note:No mass. Palpation and Percussion of the abdomen reveal- Non Tender, Non Distended + BS, no rebound or guarding.    Neurologic Cranial Nerve exam:- CN III-XII intact(No nystagmus), symmetric smile. Drift Test:- No drift. Romberg Exam:- Negative.  Heal to Toe Gait exam:-Normal. Finger to Nose:- Normal/Intact Strength:- 5/5 equal and symmetric strength both upper and lower extremities.      Assessment & Plan:  For your history of hypertension, I do want to eventually put you on blood pressure medication.  Your CT report stated a concern for cerebral vasoconstrictive syndrome.  I do think under this scenario I am going to send neurologist [name] a message and see if he agrees with the use of amlodipine.  I will send him message today and then plan on describing medication.  Also I am going to go ahead and place the neurology referral order as well.  Please get metabolic panel today.  Continue to stay away from caffeine beverages, avoid any decongestants and reduce salt intake.  For your history of seasonal allergies and recent nasal congestion with sneezing, I am going to prescribe Flonase nasal spray and Xyzal.  Follow-up in 2-3 weeks or as needed.  Esperanza RichtersEdward Sloane Palmer, PA-C

## 2017-12-31 NOTE — Patient Instructions (Addendum)
For your history of hypertension, I do want to eventually put you on blood pressure medication.  Your CT report stated a concern for cerebral vasoconstrictive syndrome.  I do think under this scenario I am going to send neurologist [name] a message and see if he agrees with the use of amlodipine.  I will send him message today and then plan on describing medication.  Also I am going to go ahead and place the neurology referral order as well.  Please get metabolic panel today.  Continue to stay away from caffeine beverages, avoid any decongestants and reduce salt intake.  For your history of seasonal allergies and recent nasal congestion with sneezing, I am going to prescribe Flonase nasal spray and Xyzal.  Follow-up in 2-3 weeks or as needed.

## 2018-01-01 NOTE — Telephone Encounter (Signed)
I think amlodipine is a good choice. I will schedule him here in the office. Thank you!!

## 2018-01-02 ENCOUNTER — Telehealth: Payer: Self-pay | Admitting: Medical

## 2018-01-02 MED ORDER — AMLODIPINE BESYLATE 5 MG PO TABS: 5.0000 mg | ORAL_TABLET | Freq: Every day | ORAL | 3 refills | Status: DC

## 2018-01-02 NOTE — Telephone Encounter (Signed)
[name],  I sent in rx of amlodipine. Thanks for your help.  Esperanza RichtersEdward Keshanna Riso, PA-C

## 2018-01-02 NOTE — Telephone Encounter (Signed)
Would you let patient know [name] agreed with starting amlodipine for his blood pressure. I sent in the prescription to his pharmacy.

## 2018-01-06 ENCOUNTER — Encounter: Payer: Self-pay | Admitting: Neurology

## 2018-01-06 ENCOUNTER — Ambulatory Visit: Payer: BLUE CROSS/BLUE SHIELD | Admitting: Neurology

## 2018-01-06 VITALS — BP 143/92 | HR 97 | Ht 69.0 in | Wt 216.8 lb

## 2018-01-06 DIAGNOSIS — R51 Headache: Secondary | ICD-10-CM

## 2018-01-06 DIAGNOSIS — I67841 Reversible cerebrovascular vasoconstriction syndrome: Secondary | ICD-10-CM

## 2018-01-06 DIAGNOSIS — R519 Headache, unspecified: Secondary | ICD-10-CM

## 2018-01-06 DIAGNOSIS — I671 Cerebral aneurysm, nonruptured: Secondary | ICD-10-CM

## 2018-01-06 DIAGNOSIS — T50905A Adverse effect of unspecified drugs, medicaments and biological substances, initial encounter: Secondary | ICD-10-CM

## 2018-01-06 DIAGNOSIS — G4441 Drug-induced headache, not elsewhere classified, intractable: Secondary | ICD-10-CM | POA: Diagnosis not present

## 2018-01-06 NOTE — Progress Notes (Signed)
[facility]    Provider:  Dr Lucia Gaskins Referring Provider: Marisue Brooklyn Primary Care Physician:  Esperanza Richters, PA-C  CC:  Headaches  HPI:  Calvin Sandoval. is a 32 y.o. male here as a referral from Dr. Alvira Monday for headaches. A week prior to hospitalization he started having headaches. He drinks multiple monster drinks daily + caffeine. No drug use. He has ben under some stress. Started during orgasm, they were acute and severe. It is improving since stopping the monster drinks. He also has untreated blood pressure and is seeing primary care. The headaches were in the temple, acute, severe. Would last briefly but he went to the hospital because it lasted hours. Ibuprofen helps before sex, slowly getting better. No associated symptoms, no focal weakness, vision changes, changes in speech, no confusion or altered mental status. He was started on Norvasc but hasn't started yet. Encouraged him to start today. He was drinking multiple caffeine drinks in addition to the monster drinks. He has stopped.   Reviewed notes, labs and imaging from outside physicians, which showed:  This is a patient who is seen at the emergency room for headaches.  He had a complaint of headache when he orgasms.  Ongoing for several weeks.  Left-sided throbbing radiates sometimes to his neck.  Nothing seems to make it better or worse.  He has had some nausea but denies vomiting.  No head injury.  Started 1 week prior.  Severity 9 out of 10.  He was seen by neurology in the emergency room.  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  He has been drinking Monster stimulants.  It was recommended a follow-up with a CTA in 3-4 weeks to see if vasoconstriction still present on CTA.  Try to keep NSAID use to a minimum.  No use of cocaine, heroin, amphetamines, THC or any other vasoconstricting medication.  Again he did admit to drinking monsters frequently.  Intracranial CT angiogram report showed diffuse abnormality of the more  distal branches suggesting reversible vasoconstrictive syndrome.  There is also dilatation of the distal left M3 M4 branch.  Personally reviewed MRI of the brain images which is negative.  Also reviewed CT Angie of the head and neck report.  CT angios of the neck was normal. CT angios of the head  As above.   Review of Systems: Patient complains of symptoms per HPI as well as the following symptoms: headache. Pertinent negatives and positives per HPI. All others negative.   Social History   Socioeconomic History  . Marital status: Single    Spouse name: Not on file  . Number of children: 2  . Years of education: 49  . Highest education level: Not on file  Occupational History  . Not on file  Social Needs  . Financial resource strain: Not on file  . Food insecurity:    Worry: Not on file    Inability: Not on file  . Transportation needs:    Medical: Not on file    Non-medical: Not on file  Tobacco Use  . Smoking status: Never Smoker  . Smokeless tobacco: Current User    Types: Chew  Substance and Sexual Activity  . Alcohol use: Yes    Alcohol/week: 7.2 oz    Types: 12 Cans of beer per week  . Drug use: No  . Sexual activity: Yes  Lifestyle  . Physical activity:    Days per week: Not on file    Minutes per session: Not on file  .  Stress: Not on file  Relationships  . Social connections:    Talks on phone: Not on file    Gets together: Not on file    Attends religious service: Not on file    Active member of club or organization: Not on file    Attends meetings of clubs or organizations: Not on file    Relationship status: Not on file  . Intimate partner violence:    Fear of current or ex partner: Not on file    Emotionally abused: Not on file    Physically abused: Not on file    Forced sexual activity: Not on file  Other Topics Concern  . Not on file  Social History Narrative   Lives at home with his girlfriend and children   Right handed   Was drinking 2  monsters per day but he has since stopped caffeine       Patient is married but separated since 2012, works as a Psychologist, occupational, 4 night shifts per week, 2 children, 5 y/o (DOB 2010) and 4 y/o (2011), eating is inconsistent, snacks a lot, stays pretty busy with work, does not exercise.    Family History  Problem Relation Age of Onset  . Hypertension Mother   . Obesity Mother   . Diabetes Mother   . Hypertension Father   . Diabetes Father   . Diabetes Sister   . Diabetes Sister   . Alcohol abuse Paternal Grandfather   . Alcohol abuse Paternal Uncle     Past Medical History:  Diagnosis Date  . Allergy   . Hypertension   . MRSA infection    on elbow and knee    Past Surgical History:  Procedure Laterality Date  . NAILBED REPAIR  07/13/2012   Procedure: NAILBED REPAIR;  Surgeon: Marlowe Shores, MD;  Location: [facility];  Service: Orthopedics;  Laterality: Left;  Left index finger    Current Outpatient Medications  Medication Sig Dispense Refill  . ibuprofen (ADVIL,MOTRIN) 200 MG tablet Take 400 mg by mouth as needed.    Marland Kitchen amLODipine (NORVASC) 5 MG tablet Take 1 tablet (5 mg total) by mouth daily. (Patient not taking: Reported on 01/06/2018) 30 tablet 3  . fluticasone (FLONASE) 50 MCG/ACT nasal spray Place 2 sprays into both nostrils daily. (Patient not taking: Reported on 01/06/2018) 16 g 1  . levocetirizine (XYZAL) 5 MG tablet Take 1 tablet (5 mg total) by mouth every evening. (Patient not taking: Reported on 01/06/2018) 30 tablet 1   No current facility-administered medications for this visit.     Allergies as of 01/06/2018 - Review Complete 01/06/2018  Allergen Reaction Noted  . Sulfa antibiotics  07/10/2012    Vitals: BP (!) 143/92 (BP Location: Right Arm, Patient Position: Sitting)   Pulse 97   Ht 5\' 9"  (1.753 m)   Wt 216 lb 12.8 oz (98.3 kg)   BMI 32.02 kg/m  Last Weight:  Wt Readings from Last 1 Encounters:  01/06/18 216 lb 12.8 oz (98.3 kg)   Last  Height:   Ht Readings from Last 1 Encounters:  01/06/18 5\' 9"  (1.753 m)   Physical exam: Exam: Gen: NAD, conversant, well nourised, obese, well groomed                     CV: RRR, no MRG. No Carotid Bruits. No peripheral edema, warm, nontender Eyes: Conjunctivae clear without exudates or hemorrhage  Neuro: Detailed Neurologic Exam  Speech:  Speech is normal; fluent and spontaneous with normal comprehension.  Cognition:    The patient is oriented to person, place, and time;     recent and remote memory intact;     language fluent;     normal attention, concentration,     fund of knowledge Cranial Nerves:    The pupils are equal, round, and reactive to light. The fundi are normal and spontaneous venous pulsations are present. Visual fields are full to finger confrontation. Extraocular movements are intact. Trigeminal sensation is intact and the muscles of mastication are normal. The face is symmetric. The palate elevates in the midline. Hearing intact. Voice is normal. Shoulder shrug is normal. The tongue has normal motion without fasciculations.   Coordination:    Normal finger to nose and heel to shin. Normal rapid alternating movements.   Gait:    Heel-toe and tandem gait are normal.   Motor Observation:    No asymmetry, no atrophy, and no involuntary movements noted. Tone:    Normal muscle tone.    Posture:    Posture is normal. normal erect    Strength:    Strength is V/V in the upper and lower limbs.      Sensation: intact to LT     Reflex Exam:  DTR's:    Deep tendon reflexes in the upper and lower extremities are normal bilaterally.   Toes:    The toes are downgoing bilaterally.   Clonus:    Clonus is absent.       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 His improved. Discussed.  CTA angio of the head repeat in 4 weeks.  Labs today Will order complete lab workup for other causes of RCVS Continue  amlodipine Stop monster/energy drinks and caffeine, discussed other causes of RCVS  Orders Placed This Encounter  Procedures  . CT ANGIO HEAD W OR WO CONTRAST  . TSH  . HIV antibody  . Sedimentation rate  . Pan-ANCA  . ANA w/Reflex  . RPR  . Hepatitis C antibody  . Rheumatoid factor  . Heavy metals, blood  . Vitamin B6  . B12 and Folate Panel  . CRP, High Sensitivity  . Vitamin B1  . Comprehensive metabolic panel  . CBC  . B. burgdorfi Antibody     Discussed: To prevent or relieve headaches, try the following: Cool Compress. Lie down and place a cool compress on your head.  Avoid headache triggers. If certain foods or odors seem to have triggered your migraines in the past, avoid them. A headache diary might help you identify triggers.  Include physical activity in your daily routine. Try a daily walk or other moderate aerobic exercise.  Manage stress. Find healthy ways to cope with the stressors, such as delegating tasks on your to-do list.  Practice relaxation techniques. Try deep breathing, yoga, massage and visualization.  Eat regularly. Eating regularly scheduled meals and maintaining a healthy diet might help prevent headaches. Also, drink plenty of fluids.  Follow a regular sleep schedule. Sleep deprivation might contribute to headaches Consider biofeedback. With this mind-body technique, you learn to control certain bodily functions - such as muscle tension, heart rate and blood pressure - to prevent headaches or reduce headache pain.    Proceed to emergency room if you experience new or worsening symptoms or symptoms do not resolve, if you have new neurologic symptoms or if headache is severe, or for any concerning symptom.  Naomie Dean, MD  [facility] [address] [place], [place] [address]  Phone [phone] Fax [phone]

## 2018-01-06 NOTE — Patient Instructions (Addendum)
CT of the head to review images (schedule for 4 weeks out) Labs today Start Amlodipine Stop Monster drinks and Caffeine  General Headache Without Cause A headache is pain or discomfort felt around the head or neck area. There are many causes and types of headaches. In some cases, the cause may not be found. Follow these instructions at home: Managing pain  Take over-the-counter and prescription medicines only as told by your doctor.  Lie down in a dark, quiet room when you have a headache.  If directed, apply ice to the head and neck area: ? Put ice in a plastic bag. ? Place a towel between your skin and the bag. ? Leave the ice on for 20 minutes, 2-3 times per day.  Use a heating pad or hot shower to apply heat to the head and neck area as told by your doctor.  Keep lights dim if bright lights bother you or make your headaches worse. Eating and drinking  Eat meals on a regular schedule.  Lessen how much alcohol you drink.  Lessen how much caffeine you drink, or stop drinking caffeine. General instructions  Keep all follow-up visits as told by your doctor. This is important.  Keep a journal to find out if certain things bring on headaches. For example, write down: ? What you eat and drink. ? How much sleep you get. ? Any change to your diet or medicines.  Relax by getting a massage or doing other relaxing activities.  Lessen stress.  Sit up straight. Do not tighten (tense) your muscles.  Do not use tobacco products. This includes cigarettes, chewing tobacco, or e-cigarettes. If you need help quitting, ask your doctor.  Exercise regularly as told by your doctor.  Get enough sleep. This often means 7-9 hours of sleep. Contact a doctor if:  Your symptoms are not helped by medicine.  You have a headache that feels different than the other headaches.  You feel sick to your stomach (nauseous) or you throw up (vomit).  You have a fever. Get help right away  if:  Your headache becomes really bad.  You keep throwing up.  You have a stiff neck.  You have trouble seeing.  You have trouble speaking.  You have pain in the eye or ear.  Your muscles are weak or you lose muscle control.  You lose your balance or have trouble walking.  You feel like you will pass out (faint) or you pass out.  You have confusion. This information is not intended to replace advice given to you by your health care provider. Make sure you discuss any questions you have with your health care provider. Document Released: 06/25/2008 Document Revised: 02/22/2016 Document Reviewed: 01/09/2015 Elsevier Interactive Patient Education  2018 Else[employer] Inc[employer]   Reversible Cerebral Vasoconstriction Syndrome Reversible cerebral vasoconstriction syndrome has the main feature of sudden, severe headaches. The syndrome is most common among women between the ages of 37 and 72. Appointments [phone] APPOINTMENTS & LOCATIONS CHAT WITH A REPRESENTATIVE CONTACT us Overview Diagnosis and Tests Management and Treatment What is reversible cerebral vasoconstriction syndrome (RCVS)? Reversible cerebral vasoconstriction syndrome (RCVS) is a rare condition that occurs as the result of a sudden constriction (tightening) of the vessels that supply blood to the brain. The main symptom of RCVS is sudden, severe, and disabling headaches that are sometimes called "thunderclap" headaches. Strokes or a bleeding into the brain may or may not be present.  RCVS can be reversed. Outcomes for RCVS patients range from  full recovery in most patients to permanent brain damage in other patients. RCVS is most common among females from the ages of 4020 to 7350.  Is reversible cerebral vasoconstriction syndrome (RCVS) dangerous? Most patients with RCVS recover completely. A minority of patients have neurological problems. RCVS does not usually come back.  What is the major condition that resembles reversible  cerebral vasoconstriction syndrome (RCVS)? Central nervous system (CNS) vasculitis can be very similar to RCVS. CNS Vasculitis is a condition that results in inflammation of blood vessel walls of the brain or spine. (The brain and the spine make up the central nervous system). CNS vasculitis often occurs in the following situations:  It appears along with other autoimmune diseases such as systemic lupus erythematosus, dermatomyositis, and rheumatoid arthritis (in rare cases). It may come along with a viral or bacterial infection. It may come along with systemic (affecting the whole body) vasculitic disorders (granulomatosis with polyangiitis (GPA), microscopic polyangiitis, Behet's syndrome). It may appear on its own; in such cases it is referred to as primary angiitis of the CNS (PACNS). Healthcare providers need to be aware of RCVS and tell the difference between RCVS and CNS vasculitis as soon as possible. The two disorders differ in treatment and outlook. Treatment of RCVS does not require immunosuppressive drugs, but CNS vasculitis does.  What is the cause of reversible cerebral vasoconstriction syndrome (RCVS)? A disturbance in the control of the smooth muscle tone inside of the brain's blood vessel walls is thought to cause RCVS. There is no evidence of inflammation or changes within the structure of the brain's blood vessels or tissue among persons with RCVS. There is no known cause for the change in the tone of blood vessels. The change is often spontaneous.  Some possible external factors related to RCVS may include the use of prescription, over the counter, or illegal drugs that can cause constriction of the arteries. RCVS also may be linked to internal factors such as tumors, which secrete substances that, in turn, constrict blood vessels.  Prescription medications associated with RCVS include the following:  Antidepressants: Selective serotonin reuptake inhibitors (SSRIs) such as  Prozac, Paxil, and Zoloft Medications to treat migraines: triptans (Imitrex, Maxalt), isometheptine (Amidrine, Midrin), and ergotamines (Migergot, Ergomar, Cafergot) Immunosuppressants: cyclophosphamide (Cytoxan) and tacrolimus (FK-506) Drugs to prevent bleeding after childbirth Anti-Parkinson's medications: bromocriptine and lisuride (Dopergin, Proclacam,and Revanil) Common over-the-counter drugs and supplements that can cause constriction of cerebral arteries include the following:  Nasal decongestants (pseudoephedrine, ephedrine, phenylpropanolamine) Nicotine patches Caffeine-containing energy drinks Ginseng Illegal drugs associated with RCVS are:  Marijuana Cocaine Ecstasy Amphetamine derivatives Lysergic acid diethylamide (LSD) Others factors related to RCVS can include blood and intravenous immunoglobulin (IVIG) transfusions as well as vasoactive secreting tumors. These tumors include phaeochromocytoma, bronchial carcinoid, and glomus tumors.  What are the symptoms of reversible cerebral vasoconstriction syndrome (RCVS)? The severe and sudden onset of headaches is present in all patients. Other symptoms may include:  Strokes or transient ischemic attacks (TIAs or "mini-strokes") Weakness Problems with eyesight Seizures

## 2018-01-07 LAB — B. BURGDORFI ANTIBODIES: Lyme IgG/IgM Ab: <0.91 {ISR} (ref 0.00–0.90)

## 2018-01-07 NOTE — Telephone Encounter (Signed)
Patient had appointment with [name] yesterday.

## 2018-01-08 ENCOUNTER — Telehealth: Payer: Self-pay | Admitting: Neurology

## 2018-01-08 DIAGNOSIS — I67841 Reversible cerebrovascular vasoconstriction syndrome: Secondary | ICD-10-CM | POA: Insufficient documentation

## 2018-01-08 NOTE — Telephone Encounter (Signed)
[employer] Auth: NPR Ref # [id number] order sent to [facility]. They will reach out to the pt to schedule.

## 2018-01-09 LAB — COMPREHENSIVE METABOLIC PANEL
ALBUMIN: 5 g/dL (ref 3.5–5.5)
ALK PHOS: 63 IU/L (ref 39–117)
ALT: 46 IU/L — ABNORMAL HIGH (ref 0–44)
AST: 25 IU/L (ref 0–40)
Albumin/Globulin Ratio: 1.9 (ref 1.2–2.2)
BILIRUBIN TOTAL: 0.6 mg/dL (ref 0.0–1.2)
BUN / CREAT RATIO: 12 (ref 9–20)
BUN: 13 mg/dL (ref 6–20)
CHLORIDE: 100 mmol/L (ref 96–106)
CO2: 24 mmol/L (ref 20–29)
CREATININE: 1.13 mg/dL (ref 0.76–1.27)
Calcium: 9.7 mg/dL (ref 8.7–10.2)
GFR calc Af Amer: 100 mL/min/{1.73_m2} (ref >59)
GFR calc non Af Amer: 86 mL/min/{1.73_m2} (ref >59)
GLUCOSE: 111 mg/dL — AB (ref 65–99)
Globulin, Total: 2.7 g/dL (ref 1.5–4.5)
Potassium: 4.3 mmol/L (ref 3.5–5.2)
SODIUM: 140 mmol/L (ref 134–144)
Total Protein: 7.7 g/dL (ref 6.0–8.5)

## 2018-01-09 LAB — B12 AND FOLATE PANEL
FOLATE: 17.1 ng/mL (ref >3.0)
VITAMIN B 12: 413 pg/mL (ref 232–1245)

## 2018-01-09 LAB — PAN-ANCA
ANCA Proteinase 3: 5.1 U/mL — ABNORMAL HIGH (ref 0.0–3.5)
Myeloperoxidase Ab: <9 U/mL (ref 0.0–9.0)

## 2018-01-09 LAB — HEPATITIS C ANTIBODY

## 2018-01-09 LAB — CBC
HEMATOCRIT: 49.3 % (ref 37.5–51.0)
Hemoglobin: 17.2 g/dL (ref 13.0–17.7)
MCH: 30.4 pg (ref 26.6–33.0)
MCHC: 34.9 g/dL (ref 31.5–35.7)
MCV: 87 fL (ref 79–97)
PLATELETS: 220 10*3/uL (ref 150–379)
RBC: 5.65 x10E6/uL (ref 4.14–5.80)
RDW: 13.9 % (ref 12.3–15.4)
WBC: 6.9 10*3/uL (ref 3.4–10.8)

## 2018-01-09 LAB — HIGH SENSITIVITY CRP: CRP, High Sensitivity: 0.73 mg/L (ref 0.00–3.00)

## 2018-01-09 LAB — SEDIMENTATION RATE: Sed Rate: 2 mm/hr (ref 0–15)

## 2018-01-09 LAB — RHEUMATOID FACTOR: Rhuematoid fact SerPl-aCnc: <10 IU/mL (ref 0.0–13.9)

## 2018-01-09 LAB — VITAMIN B6: Vitamin B6: 16.3 ug/L (ref 5.3–46.7)

## 2018-01-09 LAB — HEAVY METALS, BLOOD
Arsenic: 5 ug/L (ref 2–23)
Lead, Blood: NOT DETECTED ug/dL (ref 0–4)
Mercury: NOT DETECTED ug/L (ref 0.0–14.9)

## 2018-01-09 LAB — TSH: TSH: 3.06 u[IU]/mL (ref 0.450–4.500)

## 2018-01-09 LAB — RPR: RPR: NONREACTIVE

## 2018-01-09 LAB — VITAMIN B1: Thiamine: 152.5 nmol/L (ref 66.5–200.0)

## 2018-01-09 LAB — HIV ANTIBODY (ROUTINE TESTING W REFLEX): HIV Screen 4th Generation wRfx: NONREACTIVE

## 2018-01-09 LAB — ANA W/REFLEX: Anti Nuclear Antibody(ANA): NEGATIVE

## 2018-01-12 ENCOUNTER — Telehealth: Payer: Self-pay | Admitting: Neurology

## 2018-01-12 NOTE — Telephone Encounter (Signed)
Called the pt to review the lab work. No answer. LVM for the pt to call back.  If pt returns call please make him aware that lab was normal and there were no concerns. Thanks

## 2018-01-12 NOTE — Telephone Encounter (Signed)
Pt returning RNs call aware that labs were normal and there are no concerns

## 2018-01-12 NOTE — Telephone Encounter (Signed)
-----   Message from Anson FretAntonia B Ahern, MD sent at 01/11/2018  8:25 PM EDT ----- Labs are unremarkable thanks

## 2018-01-21 ENCOUNTER — Ambulatory Visit: Payer: BLUE CROSS/BLUE SHIELD | Admitting: Medical

## 2018-01-21 DIAGNOSIS — Z0289 Encounter for other administrative examinations: Secondary | ICD-10-CM

## 2018-02-03 DIAGNOSIS — H1013 Acute atopic conjunctivitis, bilateral: Secondary | ICD-10-CM | POA: Diagnosis not present

## 2018-07-05 DIAGNOSIS — H10013 Acute follicular conjunctivitis, bilateral: Secondary | ICD-10-CM | POA: Diagnosis not present

## 2018-07-05 DIAGNOSIS — S0501XA Injury of conjunctiva and corneal abrasion without foreign body, right eye, initial encounter: Secondary | ICD-10-CM | POA: Diagnosis not present

## 2018-07-06 DIAGNOSIS — H04123 Dry eye syndrome of bilateral lacrimal glands: Secondary | ICD-10-CM | POA: Diagnosis not present

## 2018-07-08 ENCOUNTER — Telehealth: Payer: Self-pay | Admitting: *Deleted

## 2018-07-08 ENCOUNTER — Ambulatory Visit: Payer: Self-pay | Admitting: Neurology

## 2018-07-08 NOTE — Telephone Encounter (Signed)
Patient no showed his appt on 07/08/2018 @ 09:30.

## 2018-07-09 ENCOUNTER — Encounter: Payer: Self-pay | Admitting: Neurology

## 2018-09-11 ENCOUNTER — Encounter: Payer: BLUE CROSS/BLUE SHIELD | Admitting: Medical

## 2018-09-11 DIAGNOSIS — Z0289 Encounter for other administrative examinations: Secondary | ICD-10-CM

## 2018-10-08 ENCOUNTER — Telehealth: Payer: Self-pay | Admitting: Medical

## 2018-10-08 ENCOUNTER — Ambulatory Visit (INDEPENDENT_AMBULATORY_CARE_PROVIDER_SITE_OTHER): Payer: BLUE CROSS/BLUE SHIELD | Admitting: Medical

## 2018-10-08 ENCOUNTER — Encounter: Payer: BLUE CROSS/BLUE SHIELD | Admitting: Medical

## 2018-10-08 ENCOUNTER — Encounter: Payer: Self-pay | Admitting: Medical

## 2018-10-08 VITALS — BP 140/90 | HR 80 | Temp 98.1°F | Resp 16 | Ht 69.0 in | Wt 236.8 lb

## 2018-10-08 DIAGNOSIS — Z0000 Encounter for general adult medical examination without abnormal findings: Secondary | ICD-10-CM | POA: Diagnosis not present

## 2018-10-08 LAB — POC URINALSYSI DIPSTICK (AUTOMATED)
Bilirubin, UA: NEGATIVE
Glucose, UA: NEGATIVE
KETONES UA: NEGATIVE
Leukocytes, UA: NEGATIVE
Nitrite, UA: NEGATIVE
PH UA: 6 (ref 5.0–8.0)
PROTEIN UA: NEGATIVE
RBC UA: NEGATIVE
Spec Grav, UA: 1.025 (ref 1.010–1.025)
Urobilinogen, UA: NEGATIVE E.U./dL — AB

## 2018-10-08 MED ORDER — AMLODIPINE BESYLATE 5 MG PO TABS: 5.0000 mg | ORAL_TABLET | Freq: Every day | ORAL | 3 refills | Status: DC

## 2018-10-08 NOTE — Patient Instructions (Addendum)
For you wellness exam today we got ua today. But please get [place] copy of labs that were done at work.  Flu vaccine and tdap declined.  Recommend exercise and healthy diet.  We will let you know lab results as they come in.  Follow up date appointment will be determined after lab review.    Preventive Care 18-39 Years, Male Preventive care refers to lifestyle choices and visits with your health care provider that can promote health and wellness. What does preventive care include?   A yearly physical exam. This is also called an annual well check.  Dental exams once or twice a year.  Routine eye exams. Ask your health care provider how often you should have your eyes checked.  Personal lifestyle choices, including: ? Daily care of your teeth and gums. ? Regular physical activity. ? Eating a healthy diet. ? Avoiding tobacco and drug use. ? Limiting alcohol use. ? Practicing safe sex. What happens during an annual well check? The services and screenings done by your health care provider during your annual well check will depend on your age, overall health, lifestyle risk factors, and family history of disease. Counseling Your health care provider may ask you questions about your:  Alcohol use.  Tobacco use.  Drug use.  Emotional well-being.  Home and relationship well-being.  Sexual activity.  Eating habits.  Work and work Statistician. Screening You may have the following tests or measurements:  Height, weight, and BMI.  Blood pressure.  Lipid and cholesterol levels. These may be checked every 5 years starting at age 41.  Diabetes screening. This is done by checking your blood sugar (glucose) after you have not eaten for a while (fasting).  Skin check.  Hepatitis C blood test.  Hepatitis B blood test.  Sexually transmitted disease (STD) testing. Discuss your test results, treatment options, and if necessary, the need for more tests with your health care  provider. Vaccines Your health care provider may recommend certain vaccines, such as:  Influenza vaccine. This is recommended every year.  Tetanus, diphtheria, and acellular pertussis (Tdap, Td) vaccine. You may need a Td booster every 10 years.  Varicella vaccine. You may need this if you have not been vaccinated.  HPV vaccine. If you are 52 or younger, you may need three doses over 6 months.  Measles, mumps, and rubella (MMR) vaccine. You may need at least one dose of MMR.You may also need a second dose.  Pneumococcal 13-valent conjugate (PCV13) vaccine. You may need this if you have certain conditions and have not been vaccinated.  Pneumococcal polysaccharide (PPSV23) vaccine. You may need one or two doses if you smoke cigarettes or if you have certain conditions.  Meningococcal vaccine. One dose is recommended if you are age 62-21 years and a first-year college student living in a residence hall, or if you have one of several medical conditions. You may also need additional booster doses.  Hepatitis A vaccine. You may need this if you have certain conditions or if you travel or work in places where you may be exposed to hepatitis A.  Hepatitis B vaccine. You may need this if you have certain conditions or if you travel or work in places where you may be exposed to hepatitis B.  Haemophilus influenzae type b (Hib) vaccine. You may need this if you have certain risk factors. Talk to your health care provider about which screenings and vaccines you need and how often you need them. This information is not  intended to replace advice given to you by your health care provider. Make sure you discuss any questions you have with your health care provider. Document Released: 11/12/2001 Document Revised: 04/29/2017 Document Reviewed: 07/18/2015 Elsevier Interactive Patient Education  2019 [employer].

## 2018-10-08 NOTE — Telephone Encounter (Signed)
LVM for pt to reschedule CPE for another day, provider does not do CPE after 3:00 pm. Needs rescheduling

## 2018-10-08 NOTE — Progress Notes (Signed)
Subjective:    Patient ID: Calvin Cola., male    DOB: 1986/08/17, 33 y.o.   MRN: [id number]  HPI  Pt in for cpe.  Pt is not fasting.  Pt had some blood work done at work. He is not sure what was done. On discussion sounds like cbc, cmp and lipid panel.  Pt defers flu vaccine.  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  Pt bp high initially but better on recheck. But still borderline. He ran out of bp med. When he was on bp was 130/80 range. He has bp cuff.  Pt does not smoke.       Review of Systems  Constitutional: Negative for chills, fatigue and fever.  HENT: Negative for congestion and drooling.   Respiratory: Negative for chest tightness, shortness of breath and wheezing.   Cardiovascular: Negative for chest pain and palpitations.  Gastrointestinal: Negative for abdominal distention, abdominal pain, anal bleeding, constipation, diarrhea and nausea.  Genitourinary: Negative for dysuria, enuresis and frequency.  Musculoskeletal: Negative for back pain.  Skin: Negative for rash.  Neurological: Negative for dizziness, speech difficulty, weakness and headaches.  Hematological: Negative for adenopathy. Does not bruise/bleed easily.  Psychiatric/Behavioral: Negative for behavioral problems and dysphoric mood.    Past Medical History:  Diagnosis Date  . Allergy   . Hypertension   . MRSA infection    on elbow and knee     Social History   Socioeconomic History  . Marital status: Single    Spouse name: Not on file  . Number of children: 2  . Years of education: 6  . Highest education level: Not on file  Occupational History  . Not on file  Social Needs  . Financial resource strain: Not on file  . Food insecurity:    Worry: Not on file    Inability: Not on file  . Transportation needs:    Medical: Not on file    Non-medical: Not on file  Tobacco Use  . Smoking status: Never Smoker  . Smokeless tobacco: Current User    Types: Chew  Substance  and Sexual Activity  . Alcohol use: Yes    Alcohol/week: 12.0 standard drinks    Types: 12 Cans of beer per week  . Drug use: No  . Sexual activity: Yes  Lifestyle  . Physical activity:    Days per week: Not on file    Minutes per session: Not on file  . Stress: Not on file  Relationships  . Social connections:    Talks on phone: Not on file    Gets together: Not on file    Attends religious service: Not on file    Active member of club or organization: Not on file    Attends meetings of clubs or organizations: Not on file    Relationship status: Not on file  . Intimate partner violence:    Fear of current or ex partner: Not on file    Emotionally abused: Not on file    Physically abused: Not on file    Forced sexual activity: Not on file  Other Topics Concern  . Not on file  Social History Narrative   Lives at home with his girlfriend and children   Right handed   Was drinking 2 monsters per day but he has since stopped caffeine       Patient is married but separated since 2012, works as a Psychologist, occupational, 4 night shifts per week, 2 children, 33  y/o (DOB 2010) and 4 y/o (2011), eating is inconsistent, snacks a lot, stays pretty busy with work, does not exercise.    Past Surgical History:  Procedure Laterality Date  . NAILBED REPAIR  07/13/2012   Procedure: NAILBED REPAIR;  Surgeon: Marlowe ShoresMatthew A Weingold, MD;  Location: [facility];  Service: Orthopedics;  Laterality: Left;  Left index finger    Family History  Problem Relation Age of Onset  . Hypertension Mother   . Obesity Mother   . Diabetes Mother   . Hypertension Father   . Diabetes Father   . Diabetes Sister   . Diabetes Sister   . Alcohol abuse Paternal Grandfather   . Alcohol abuse Paternal Uncle     Allergies  Allergen Reactions  . Sulfa Antibiotics     Current Outpatient Medications on File Prior to Visit  Medication Sig Dispense Refill  . amLODipine (NORVASC) 5 MG tablet Take 1 tablet (5 mg  total) by mouth daily. 30 tablet 3  . fluticasone (FLONASE) 50 MCG/ACT nasal spray Place 2 sprays into both nostrils daily. 16 g 1  . ibuprofen (ADVIL,MOTRIN) 200 MG tablet Take 400 mg by mouth as needed.    Marland Kitchen. levocetirizine (XYZAL) 5 MG tablet Take 1 tablet (5 mg total) by mouth every evening. 30 tablet 1   No current facility-administered medications on file prior to visit.     BP (!) 146/91   Pulse 80   Temp 98.1 F (36.7 C) (Oral)   Resp 16   Ht 5\' 9"  (1.753 m)   Wt 236 lb 12.8 oz (107.4 kg)   SpO2 99%   BMI 34.97 kg/m       Objective:   Physical Exam  General Mental Status- Alert. General Appearance- Not in acute distress.   Skin Small scattered moles. None worrisome features or changes  Neck Carotid Arteries- Normal color. Moisture- Normal Moisture. No carotid bruits. No JVD.  Chest and Lung Exam Auscultation: Breath Sounds:-Normal.  Cardiovascular Auscultation:Rythm- Regular. Murmurs & Other Heart Sounds:Auscultation of the heart reveals- No Murmurs.  Abdomen Inspection:-Inspeection Normal. Palpation/Percussion:Note:No mass. Palpation and Percussion of the abdomen reveal- Non Tender, Non Distended + BS, no rebound or guarding.   Neurologic Cranial Nerve exam:- CN III-XII intact(No nystagmus), symmetric smile. Strength:- 5/5 equal and symmetric strength both upper and lower extremities.      Assessment & Plan:  For you wellness exam today we got ua today. But please get us copy of labs that were done at work.  Flu vaccine and tdap declined.  Recommend exercise and healthy diet.  We will let you know lab results as they come in.  Follow up date appointment will be determined after lab review.   Esperanza RichtersEdward Henreitta Spittler, PA-C

## 2018-12-14 DIAGNOSIS — J111 Influenza due to unidentified influenza virus with other respiratory manifestations: Secondary | ICD-10-CM | POA: Diagnosis not present

## 2019-08-10 DIAGNOSIS — H04123 Dry eye syndrome of bilateral lacrimal glands: Secondary | ICD-10-CM | POA: Diagnosis not present

## 2019-08-10 DIAGNOSIS — H40033 Anatomical narrow angle, bilateral: Secondary | ICD-10-CM | POA: Diagnosis not present

## 2019-08-23 DIAGNOSIS — H00021 Hordeolum internum right upper eyelid: Secondary | ICD-10-CM | POA: Diagnosis not present

## 2019-10-19 ENCOUNTER — Ambulatory Visit (INDEPENDENT_AMBULATORY_CARE_PROVIDER_SITE_OTHER): Payer: BC Managed Care – PPO | Admitting: Medical

## 2019-10-19 ENCOUNTER — Encounter: Payer: Self-pay | Admitting: Medical

## 2019-10-19 ENCOUNTER — Other Ambulatory Visit: Payer: Self-pay

## 2019-10-19 VITALS — BP 150/90 | HR 104 | Temp 96.6°F | Resp 18 | Ht 69.0 in | Wt 242.0 lb

## 2019-10-19 DIAGNOSIS — Z0000 Encounter for general adult medical examination without abnormal findings: Secondary | ICD-10-CM | POA: Diagnosis not present

## 2019-10-19 LAB — COMPREHENSIVE METABOLIC PANEL
ALT: 67 U/L — ABNORMAL HIGH (ref 0–53)
AST: 37 U/L (ref 0–37)
Albumin: 4.6 g/dL (ref 3.5–5.2)
Alkaline Phosphatase: 60 U/L (ref 39–117)
BUN: 18 mg/dL (ref 6–23)
CO2: 28 mEq/L (ref 19–32)
Calcium: 9.8 mg/dL (ref 8.4–10.5)
Chloride: 101 mEq/L (ref 96–112)
Creatinine, Ser: 1.2 mg/dL (ref 0.40–1.50)
GFR: 69.55 mL/min (ref >60.00)
Glucose, Bld: 108 mg/dL — ABNORMAL HIGH (ref 70–99)
Potassium: 4.1 mEq/L (ref 3.5–5.1)
Sodium: 137 mEq/L (ref 135–145)
Total Bilirubin: 1 mg/dL (ref 0.2–1.2)
Total Protein: 7.4 g/dL (ref 6.0–8.3)

## 2019-10-19 LAB — LIPID PANEL
Cholesterol: 192 mg/dL (ref 0–200)
HDL: 41.9 mg/dL (ref >39.00)
LDL Cholesterol: 118 mg/dL — ABNORMAL HIGH (ref 0–99)
NonHDL: 149.66
Total CHOL/HDL Ratio: 5
Triglycerides: 156 mg/dL — ABNORMAL HIGH (ref 0.0–149.0)
VLDL: 31.2 mg/dL (ref 0.0–40.0)

## 2019-10-19 LAB — CBC WITH DIFFERENTIAL/PLATELET
Basophils Absolute: 0 10*3/uL (ref 0.0–0.1)
Basophils Relative: 0.4 % (ref 0.0–3.0)
Eosinophils Absolute: 0.3 10*3/uL (ref 0.0–0.7)
Eosinophils Relative: 4.5 % (ref 0.0–5.0)
HCT: 50.2 % (ref 39.0–52.0)
Hemoglobin: 17.3 g/dL — ABNORMAL HIGH (ref 13.0–17.0)
Lymphocytes Relative: 25.7 % (ref 12.0–46.0)
Lymphs Abs: 1.5 10*3/uL (ref 0.7–4.0)
MCHC: 34.3 g/dL (ref 30.0–36.0)
MCV: 88.1 fl (ref 78.0–100.0)
Monocytes Absolute: 0.5 10*3/uL (ref 0.1–1.0)
Monocytes Relative: 9 % (ref 3.0–12.0)
Neutro Abs: 3.6 10*3/uL (ref 1.4–7.7)
Neutrophils Relative %: 60.4 % (ref 43.0–77.0)
Platelets: 212 10*3/uL (ref 150.0–400.0)
RBC: 5.7 Mil/uL (ref 4.22–5.81)
RDW: 13.3 % (ref 11.5–15.5)
WBC: 6 10*3/uL (ref 4.0–10.5)

## 2019-10-19 NOTE — Patient Instructions (Addendum)
For you wellness exam today I have ordered cbc, cmp and lipid panel.  Declines flu vaccine and tdap today. If change your mind let know.  Recommend exercise and healthy diet.  We will let you know lab results as they come in.  Follow up date appointment will be determined after lab review.   Please take your bp daily. Then schedule phone visit follow up in 2 weeks. Want to make sure 5 mg is adequate dose.   Preventive Care 67-34 Years Old, Male Preventive care refers to lifestyle choices and visits with your health care provider that can promote health and wellness. This includes:  A yearly physical exam. This is also called an annual well check.  Regular dental and eye exams.  Immunizations.  Screening for certain conditions.  Healthy lifestyle choices, such as eating a healthy diet, getting regular exercise, not using drugs or products that contain nicotine and tobacco, and limiting alcohol use. What can I expect for my preventive care visit? Physical exam Your health care provider will check:  Height and weight. These may be used to calculate body mass index (BMI), which is a measurement that tells if you are at a healthy weight.  Heart rate and blood pressure.  Your skin for abnormal spots. Counseling Your health care provider may ask you questions about:  Alcohol, tobacco, and drug use.  Emotional well-being.  Home and relationship well-being.  Sexual activity.  Eating habits.  Work and work Statistician. What immunizations do I need?  Influenza (flu) vaccine  This is recommended every year. Tetanus, diphtheria, and pertussis (Tdap) vaccine  You may need a Td booster every 10 years. Varicella (chickenpox) vaccine  You may need this vaccine if you have not already been vaccinated. Human papillomavirus (HPV) vaccine  If recommended by your health care provider, you may need three doses over 6 months. Measles, mumps, and rubella (MMR) vaccine  You may  need at least one dose of MMR. You may also need a second dose. Meningococcal conjugate (MenACWY) vaccine  One dose is recommended if you are 74-10 years old and a Market researcher living in a residence hall, or if you have one of several medical conditions. You may also need additional booster doses. Pneumococcal conjugate (PCV13) vaccine  You may need this if you have certain conditions and were not previously vaccinated. Pneumococcal polysaccharide (PPSV23) vaccine  You may need one or two doses if you smoke cigarettes or if you have certain conditions. Hepatitis A vaccine  You may need this if you have certain conditions or if you travel or work in places where you may be exposed to hepatitis A. Hepatitis B vaccine  You may need this if you have certain conditions or if you travel or work in places where you may be exposed to hepatitis B. Haemophilus influenzae type b (Hib) vaccine  You may need this if you have certain risk factors. You may receive vaccines as individual doses or as more than one vaccine together in one shot (combination vaccines). Talk with your health care provider about the risks and benefits of combination vaccines. What tests do I need? Blood tests  Lipid and cholesterol levels. These may be checked every 5 years starting at age 28.  Hepatitis C test.  Hepatitis B test. Screening   Diabetes screening. This is done by checking your blood sugar (glucose) after you have not eaten for a while (fasting).  Sexually transmitted disease (STD) testing. Talk with your health care provider  about your test results, treatment options, and if necessary, the need for more tests. Follow these instructions at home: Eating and drinking   Eat a diet that includes fresh fruits and vegetables, whole grains, lean protein, and low-fat dairy products.  Take vitamin and mineral supplements as recommended by your health care provider.  Do not drink alcohol if  your health care provider tells you not to drink.  If you drink alcohol: ? Limit how much you have to 0-2 drinks a day. ? Be aware of how much alcohol is in your drink. In the [place] one drink equals one 12 oz bottle of beer (355 mL), one 5 oz glass of wine (148 mL), or one 1 oz glass of hard liquor (44 mL). Lifestyle  Take daily care of your teeth and gums.  Stay active. Exercise for at least 30 minutes on 5 or more days each week.  Do not use any products that contain nicotine or tobacco, such as cigarettes, e-cigarettes, and chewing tobacco. If you need help quitting, ask your health care provider.  If you are sexually active, practice safe sex. Use a condom or other form of protection to prevent STIs (sexually transmitted infections). What's next?  Go to your health care provider once a year for a well check visit.  Ask your health care provider how often you should have your eyes and teeth checked.  Stay up to date on all vaccines. This information is not intended to replace advice given to you by your health care provider. Make sure you discuss any questions you have with your health care provider. Document Revised: 09/10/2018 Document Reviewed: 09/10/2018 Elsevier Patient Education  2020 [employer].

## 2019-10-19 NOTE — Progress Notes (Signed)
Subjective:    Patient ID: Calvin Cola., male    DOB: 22-Apr-1986, 34 y.o.   MRN: [id number]  HPI Pt in for cpe.  Pt is  fasting.  Pt had some blood work done at work. He is not sure what was done. On discussion sounds like cbc, cmp and lipid panel.  Pt defers flu vaccine.  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  Pt bp is high when I checked  He is on bp medication. He forgot to take med. yesterday and today. He admits not taking it daily.   Pt does not smoke.   Review of Systems  Constitutional: Negative for chills, fatigue and fever.  HENT: Negative for postnasal drip, rhinorrhea and sinus pain.   Respiratory: Negative for cough, chest tightness, shortness of breath and wheezing.   Cardiovascular: Negative for chest pain and palpitations.  Gastrointestinal: Negative for abdominal distention, anal bleeding, blood in stool, constipation, diarrhea and rectal pain.  Genitourinary: Negative for difficulty urinating, dysuria, frequency, hematuria and urgency.  Musculoskeletal: Negative for back pain, joint swelling and neck stiffness.  Skin: Negative for rash.  Neurological: Negative for dizziness, seizures, syncope, weakness and numbness.  Hematological: Negative for adenopathy. Does not bruise/bleed easily.  Psychiatric/Behavioral: Negative for behavioral problems, confusion, dysphoric mood, self-injury and suicidal ideas. The patient is not nervous/anxious.     Past Medical History:  Diagnosis Date  . Allergy   . Hypertension   . MRSA infection    on elbow and knee     Social History   Socioeconomic History  . Marital status: Single    Spouse name: Not on file  . Number of children: 2  . Years of education: 14  . Highest education level: Not on file  Occupational History  . Not on file  Tobacco Use  . Smoking status: Never Smoker  . Smokeless tobacco: Current User    Types: Chew  Substance and Sexual Activity  . Alcohol use: Yes   Alcohol/week: 12.0 standard drinks    Types: 12 Cans of beer per week  . Drug use: No  . Sexual activity: Yes  Other Topics Concern  . Not on file  Social History Narrative   Lives at home with his girlfriend and children   Right handed   Was drinking 2 monsters per day but he has since stopped caffeine       Patient is married but separated since 2012, works as a Psychologist, occupational, 4 night shifts per week, 2 children, 5 y/o (DOB 2010) and 4 y/o (2011), eating is inconsistent, snacks a lot, stays pretty busy with work, does not exercise.   Social Determinants of Health   Financial Resource Strain:   . Difficulty of Paying Living Expenses: Not on file  Food Insecurity:   . Worried About Programme researcher, broadcasting/film/video in the Last Year: Not on file  . Ran Out of Food in the Last Year: Not on file  Transportation Needs:   . Lack of Transportation (Medical): Not on file  . Lack of Transportation (Non-Medical): Not on file  Physical Activity:   . Days of Exercise per Week: Not on file  . Minutes of Exercise per Session: Not on file  Stress:   . Feeling of Stress : Not on file  Social Connections:   . Frequency of Communication with Friends and Family: Not on file  . Frequency of Social Gatherings with Friends and Family: Not on file  . Attends Religious Services:  Not on file  . Active Member of Clubs or Organizations: Not on file  . Attends Archivist Meetings: Not on file  . Marital Status: Not on file  Intimate Partner Violence:   . Fear of Current or Ex-Partner: Not on file  . Emotionally Abused: Not on file  . Physically Abused: Not on file  . Sexually Abused: Not on file    Past Surgical History:  Procedure Laterality Date  . NAILBED REPAIR  07/13/2012   Procedure: NAILBED REPAIR;  Surgeon: Schuyler Amor, MD;  Location: [facility];  Service: Orthopedics;  Laterality: Left;  Left index finger    Family History  Problem Relation Age of Onset  . Hypertension  Mother   . Obesity Mother   . Diabetes Mother   . Hypertension Father   . Diabetes Father   . Diabetes Sister   . Diabetes Sister   . Alcohol abuse Paternal Grandfather   . Alcohol abuse Paternal Uncle     Allergies  Allergen Reactions  . Sulfa Antibiotics     Current Outpatient Medications on File Prior to Visit  Medication Sig Dispense Refill  . amLODipine (NORVASC) 5 MG tablet Take 1 tablet (5 mg total) by mouth daily. 90 tablet 3  . fluticasone (FLONASE) 50 MCG/ACT nasal spray Place 2 sprays into both nostrils daily. 16 g 1  . ibuprofen (ADVIL,MOTRIN) 200 MG tablet Take 400 mg by mouth as needed.    Marland Kitchen levocetirizine (XYZAL) 5 MG tablet Take 1 tablet (5 mg total) by mouth every evening. 30 tablet 1   No current facility-administered medications on file prior to visit.    BP (!) 138/104 (BP Location: Right Arm, Patient Position: Sitting, Cuff Size: Large)   Pulse (!) 104   Temp (!) 96.6 F (35.9 C) (Temporal)   Resp 18   Ht 5\' 9"  (1.753 m)   Wt 242 lb (109.8 kg)   SpO2 96%   BMI 35.74 kg/m       Objective:   Physical Exam  General Mental Status- Alert. General Appearance- Not in acute distress.   Skin General: Color- Normal Color. Moisture- Normal Moisture.  Neck Carotid Arteries- Normal color. Moisture- Normal Moisture. No carotid bruits. No JVD.  Chest and Lung Exam Auscultation: Breath Sounds:-Normal.  Cardiovascular Auscultation:Rythm- Regular. Murmurs & Other Heart Sounds:Auscultation of the heart reveals- No Murmurs.  Abdomen Inspection:-Inspeection Normal. Palpation/Percussion:Note:No mass. Palpation and Percussion of the abdomen reveal- Non Tender, Non Distended + BS, no rebound or guarding.   Neurologic Cranial Nerve exam:- CN III-XII intact(No nystagmus), symmetric smile. Strength:- 5/5 equal and symmetric strength both upper and lower extremities.      Assessment & Plan:  For you wellness exam today I have ordered cbc, cmp and lipid  panel.  Declines flu vaccine and tdap today. If change your mind let know.  Recommend exercise and healthy diet.  We will let you know lab results as they come in.  Follow up date appointment will be determined after lab review.   Please take your bp daily. Then schedule phone visit follow up in 2 weeks. Want to make sure 5 mg is adequate dose.  Mackie Pai, PA-C

## 2019-10-21 ENCOUNTER — Encounter: Payer: BLUE CROSS/BLUE SHIELD | Admitting: Medical

## 2019-10-22 ENCOUNTER — Other Ambulatory Visit (INDEPENDENT_AMBULATORY_CARE_PROVIDER_SITE_OTHER): Payer: BC Managed Care – PPO

## 2019-10-22 DIAGNOSIS — R7309 Other abnormal glucose: Secondary | ICD-10-CM | POA: Diagnosis not present

## 2019-10-22 LAB — HEMOGLOBIN A1C: Hgb A1c MFr Bld: 5.5 % (ref 4.6–6.5)

## 2019-11-02 ENCOUNTER — Other Ambulatory Visit: Payer: Self-pay

## 2019-11-02 ENCOUNTER — Encounter: Payer: BC Managed Care – PPO | Admitting: Medical

## 2019-11-02 DIAGNOSIS — Z0289 Encounter for other administrative examinations: Secondary | ICD-10-CM

## 2019-12-28 DIAGNOSIS — M25674 Stiffness of right foot, not elsewhere classified: Secondary | ICD-10-CM | POA: Diagnosis not present

## 2019-12-28 DIAGNOSIS — M79671 Pain in right foot: Secondary | ICD-10-CM | POA: Diagnosis not present

## 2019-12-28 DIAGNOSIS — I10 Essential (primary) hypertension: Secondary | ICD-10-CM | POA: Diagnosis not present

## 2019-12-29 ENCOUNTER — Ambulatory Visit
Admission: RE | Admit: 2019-12-29 | Discharge: 2019-12-29 | Disposition: A | Discharge status: Home / Self Care | Payer: BC Managed Care – PPO | Source: Ambulatory Visit | Attending: Family Medicine | Admitting: Family Medicine

## 2019-12-29 ENCOUNTER — Other Ambulatory Visit: Payer: Self-pay | Admitting: Family Medicine

## 2019-12-29 DIAGNOSIS — M79671 Pain in right foot: Secondary | ICD-10-CM

## 2019-12-29 DIAGNOSIS — M25674 Stiffness of right foot, not elsewhere classified: Secondary | ICD-10-CM

## 2019-12-30 ENCOUNTER — Ambulatory Visit: Payer: BC Managed Care – PPO | Admitting: Family Medicine

## 2020-01-18 ENCOUNTER — Other Ambulatory Visit: Payer: Self-pay

## 2020-01-18 ENCOUNTER — Ambulatory Visit (INDEPENDENT_AMBULATORY_CARE_PROVIDER_SITE_OTHER): Payer: BC Managed Care – PPO

## 2020-01-18 ENCOUNTER — Ambulatory Visit (INDEPENDENT_AMBULATORY_CARE_PROVIDER_SITE_OTHER): Payer: BC Managed Care – PPO | Admitting: Podiatry

## 2020-01-18 ENCOUNTER — Other Ambulatory Visit: Payer: Self-pay | Admitting: Podiatry

## 2020-01-18 DIAGNOSIS — G5761 Lesion of plantar nerve, right lower limb: Secondary | ICD-10-CM | POA: Diagnosis not present

## 2020-01-18 DIAGNOSIS — M7741 Metatarsalgia, right foot: Secondary | ICD-10-CM

## 2020-01-18 DIAGNOSIS — M79671 Pain in right foot: Secondary | ICD-10-CM

## 2020-01-18 DIAGNOSIS — M21961 Unspecified acquired deformity of right lower leg: Secondary | ICD-10-CM | POA: Diagnosis not present

## 2020-01-18 NOTE — Progress Notes (Signed)
  Subjective:  Patient ID: Calvin Oats., male    DOB: 10/26/1985,  MRN: [id number]  Chief Complaint  Patient presents with  . Foot Pain    Pain at Rt sub met 4th x 1 yr; 5-10/10 shapr pains -pt denis injury -worse after working all day -pt denis swelling tx: none -pt states he had XRs at GI    34 y.o. male presents with the above complaint. History confirmed with patient.   Objective:  Physical Exam: warm, good capillary refill, no trophic changes or ulcerative lesions, normal DP and PT pulses and normal sensory exam. Right Foot: POP Right 4th met and 3rd interspace. No altered sensation or tinel's sign 3rd interspace 3rd/4th toes   No images are attached to the encounter.  Radiographs: X-ray of the right foot: shortened fourth metatarsal  With medial angulation Assessment:   1. Morton neuroma, right   2. Metatarsal deformity, right   3. Metatarsalgia, right foot    Plan:  Patient was evaluated and treated and all questions answered.  Capsulitis ?Morton Neuroma -Educated on etiology -Educated on padding and proper shoegear -XR reviewed with patient -Injection delivered to the affected interspaces  Procedure: Neuroma Injection Location: Right 3 interspace proximal Skin Prep: Alcohol. Injectate: 1 cc 0.5% marcaine plain, 1 cc dexamethasone phosphate. Disposition: Patient tolerated procedure well. Injection site dressed with a band-aid.  Return in about 3 weeks (around 02/08/2020) for Neuroma, Right.

## 2020-01-21 ENCOUNTER — Other Ambulatory Visit: Payer: Self-pay | Admitting: Podiatry

## 2020-01-21 DIAGNOSIS — G5761 Lesion of plantar nerve, right lower limb: Secondary | ICD-10-CM

## 2020-02-08 ENCOUNTER — Other Ambulatory Visit: Payer: Self-pay

## 2020-02-08 ENCOUNTER — Ambulatory Visit: Payer: BC Managed Care – PPO | Admitting: Podiatry

## 2020-02-08 DIAGNOSIS — G5761 Lesion of plantar nerve, right lower limb: Secondary | ICD-10-CM

## 2020-02-09 DIAGNOSIS — H04123 Dry eye syndrome of bilateral lacrimal glands: Secondary | ICD-10-CM | POA: Diagnosis not present

## 2020-03-07 ENCOUNTER — Ambulatory Visit (INDEPENDENT_AMBULATORY_CARE_PROVIDER_SITE_OTHER): Payer: BC Managed Care – PPO | Admitting: Podiatry

## 2020-03-07 DIAGNOSIS — Z5329 Procedure and treatment not carried out because of patient's decision for other reasons: Secondary | ICD-10-CM

## 2020-03-07 NOTE — Progress Notes (Signed)
No show for appt. 

## 2020-04-12 NOTE — Progress Notes (Signed)
  Subjective:  Patient ID: Dewaine Oats., male    DOB: 08-06-1986,  MRN: [id number]  No chief complaint on file.  34 y.o. male presents for follow up. States it still hurts, rated 8/10. Worse after walking all day. Injection did help for a bit.  Objective:  Physical Exam: warm, good capillary refill, no trophic changes or ulcerative lesions, normal DP and PT pulses and normal sensory exam. Right Foot: POP Right 4th met and 3rd interspace. No altered sensation or tinel's sign 3rd interspace 3rd/4th toes   No images are attached to the encounter. Assessment:   1. Morton neuroma, right    Plan:  Patient was evaluated and treated and all questions answered.  Morton Neuroma -Repeat injection as below -Metatarsal pad applied.  Procedure: Neuroma Injection Location: Right 3 interspace proximal Skin Prep: Alcohol. Injectate: 1 cc 0.5% marcaine plain, 1 cc dexamethasone phosphate. Disposition: Patient tolerated procedure well. Injection site dressed with a band-aid.  No follow-ups on file.

## 2020-07-28 DIAGNOSIS — H43393 Other vitreous opacities, bilateral: Secondary | ICD-10-CM | POA: Diagnosis not present

## 2020-07-28 DIAGNOSIS — H40033 Anatomical narrow angle, bilateral: Secondary | ICD-10-CM | POA: Diagnosis not present

## 2020-10-20 IMAGING — DX DG FOOT COMPLETE 3+V*R*
4 series · 4 of 4 positions shown · non-contrast
Comparison: None.

CLINICAL DATA: Right foot pain and stiffness for 1 year

EXAM:
RIGHT FOOT COMPLETE - 3+ VIEW

[dg foot complete right (1 of 4)]
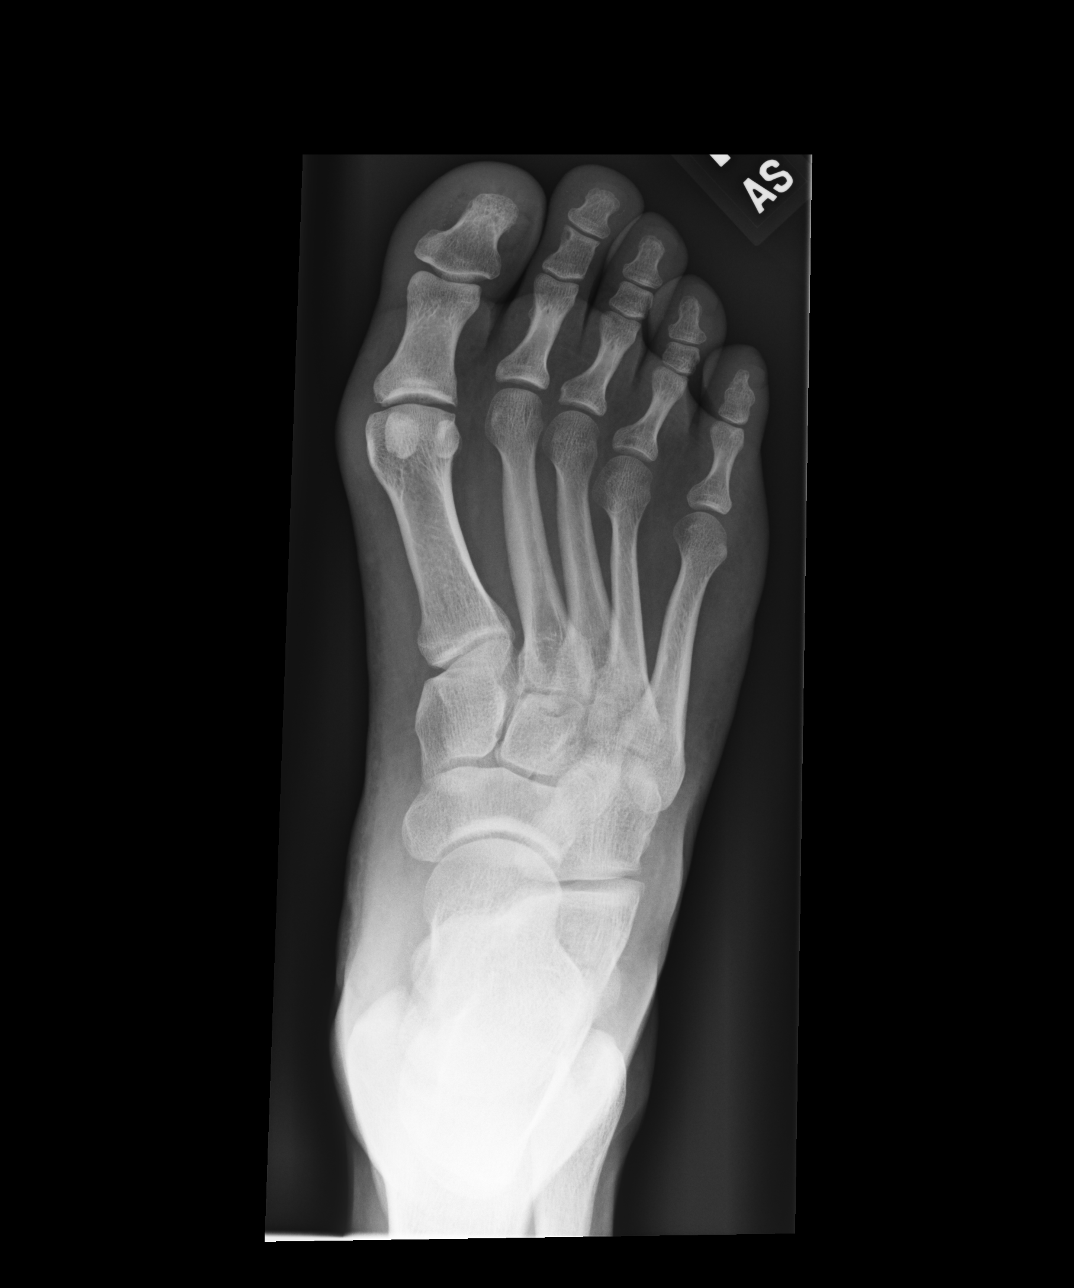

[dg foot complete right (2 of 4)]
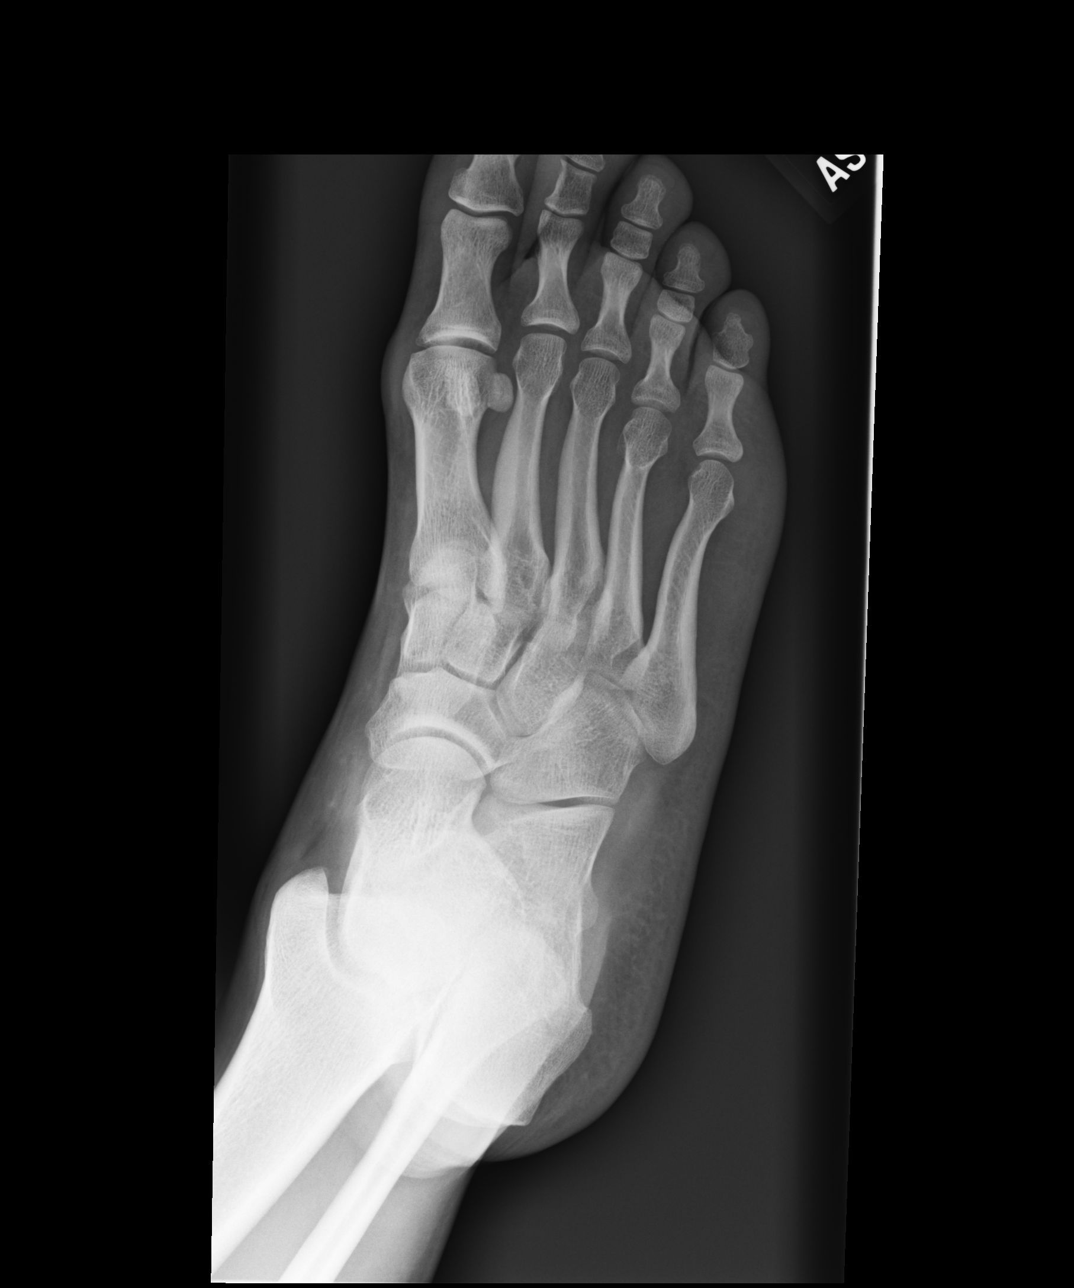

[dg foot complete right (3 of 4)]
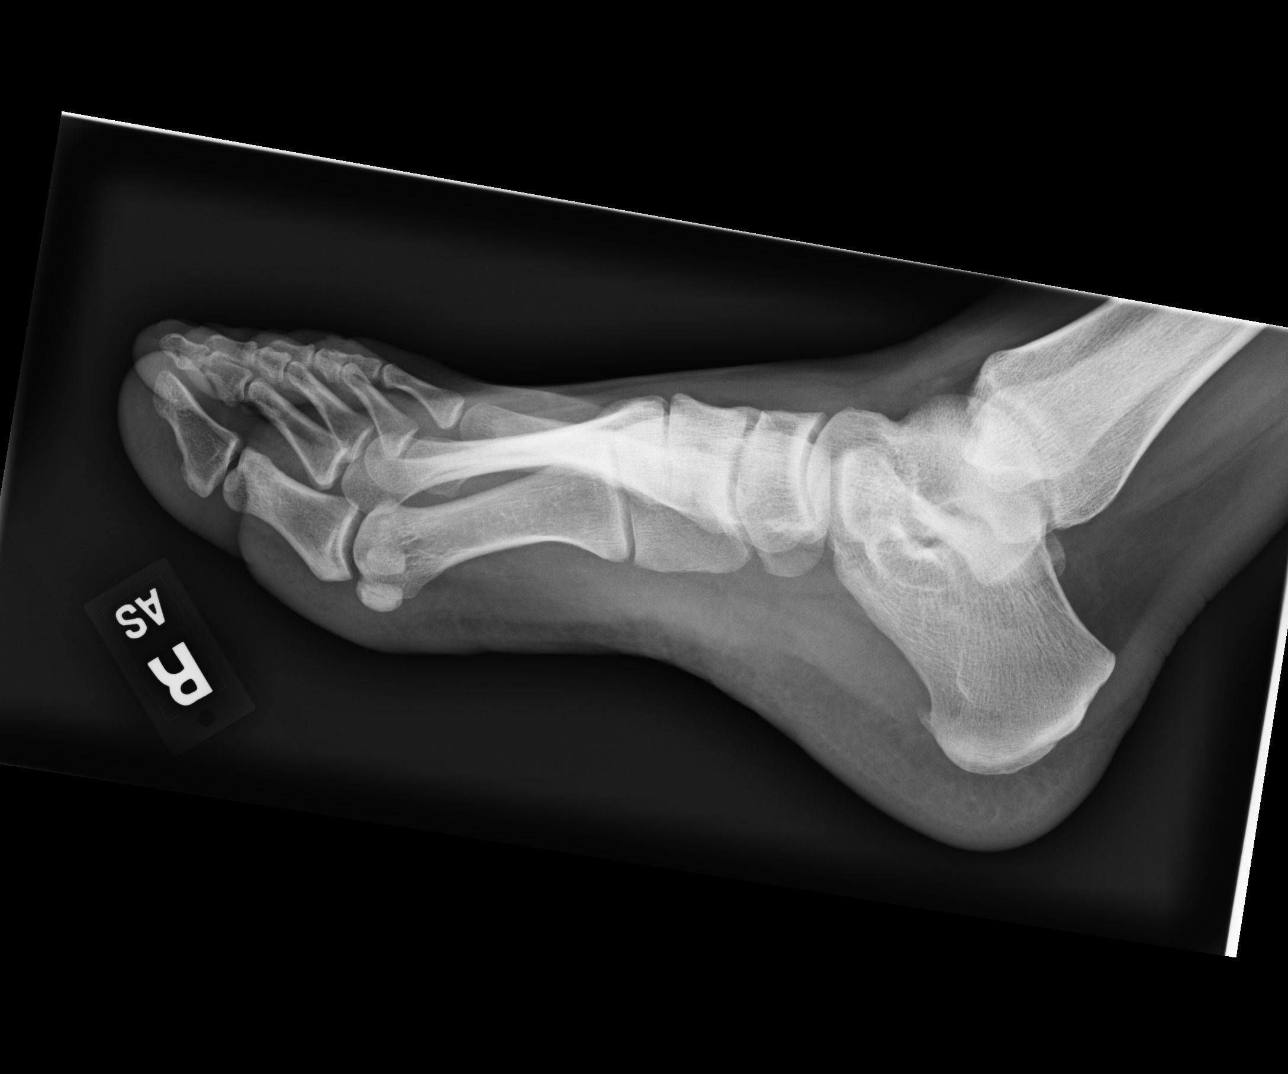

[dg foot complete right (4 of 4)]
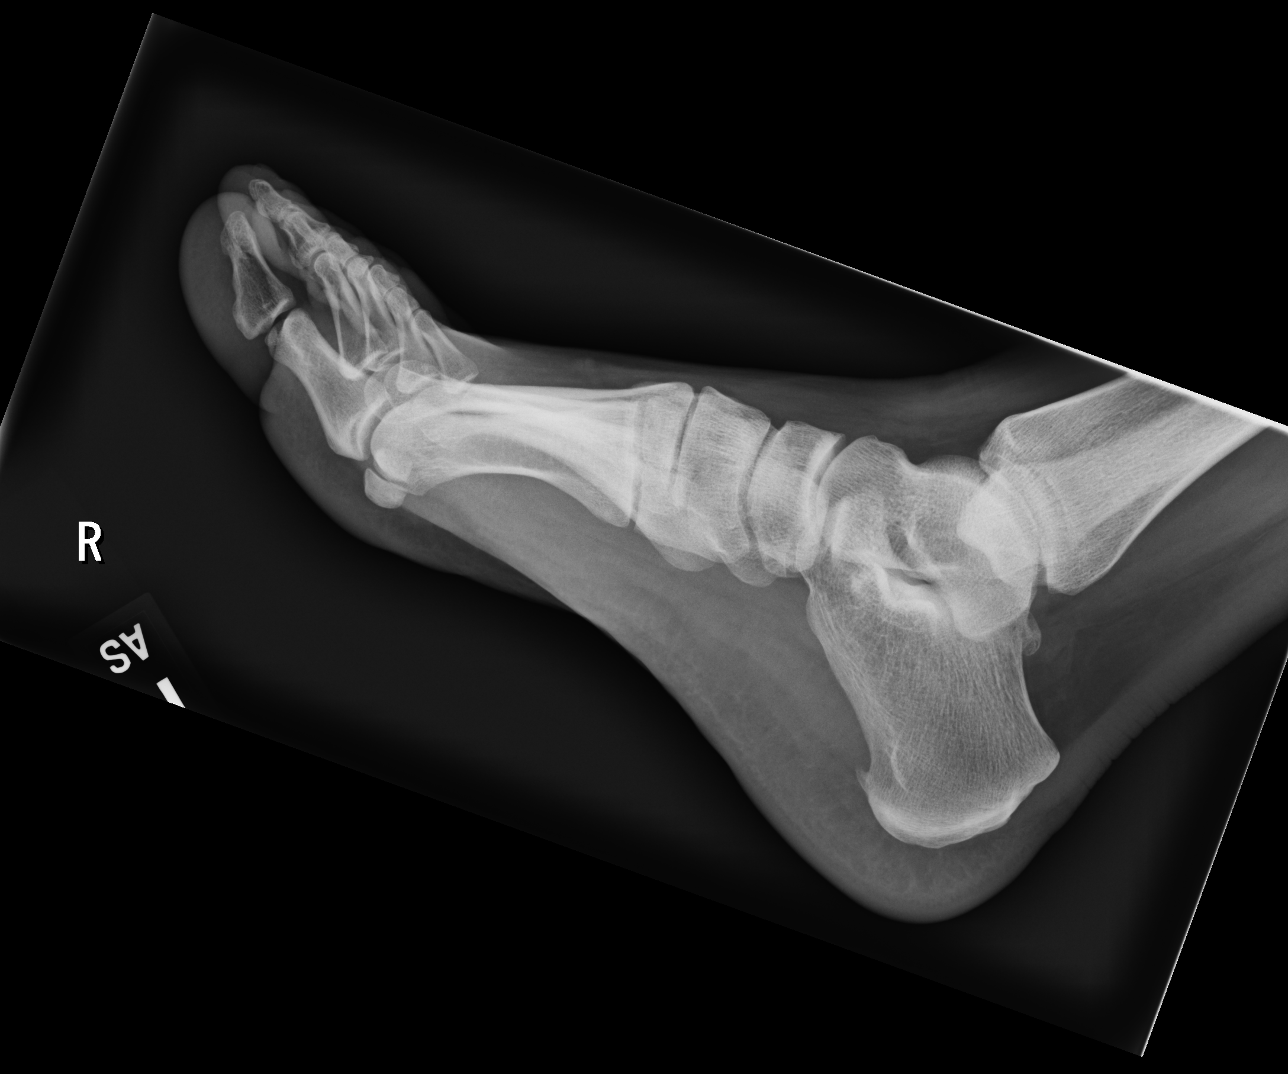

[4 of 4 positions shown; findings below may reference images not displayed]

FINDINGS: Frontal, oblique, and lateral views of the right foot are obtained.
No fracture, subluxation, or dislocation. Joint spaces are well
preserved. Soft tissues are normal.
IMPRESSION: 1. Unremarkable right foot.

## 2020-10-27 ENCOUNTER — Ambulatory Visit (INDEPENDENT_AMBULATORY_CARE_PROVIDER_SITE_OTHER): Payer: BC Managed Care – PPO | Admitting: Medical

## 2020-10-27 ENCOUNTER — Other Ambulatory Visit: Payer: Self-pay

## 2020-10-27 ENCOUNTER — Encounter: Payer: Self-pay | Admitting: Medical

## 2020-10-27 VITALS — BP 140/98 | HR 79 | Resp 18 | Ht 69.0 in | Wt 246.6 lb

## 2020-10-27 DIAGNOSIS — Z0000 Encounter for general adult medical examination without abnormal findings: Secondary | ICD-10-CM | POA: Diagnosis not present

## 2020-10-27 DIAGNOSIS — I10 Essential (primary) hypertension: Secondary | ICD-10-CM

## 2020-10-27 LAB — COMPREHENSIVE METABOLIC PANEL
ALT: 50 U/L (ref 0–53)
AST: 28 U/L (ref 0–37)
Albumin: 4.7 g/dL (ref 3.5–5.2)
Alkaline Phosphatase: 60 U/L (ref 39–117)
BUN: 16 mg/dL (ref 6–23)
CO2: 29 mEq/L (ref 19–32)
Calcium: 9.9 mg/dL (ref 8.4–10.5)
Chloride: 105 mEq/L (ref 96–112)
Creatinine, Ser: 1.13 mg/dL (ref 0.40–1.50)
GFR: 84.85 mL/min (ref >60.00)
Glucose, Bld: 94 mg/dL (ref 70–99)
Potassium: 4.5 mEq/L (ref 3.5–5.1)
Sodium: 141 mEq/L (ref 135–145)
Total Bilirubin: 0.9 mg/dL (ref 0.2–1.2)
Total Protein: 7.5 g/dL (ref 6.0–8.3)

## 2020-10-27 LAB — CBC WITH DIFFERENTIAL/PLATELET
Basophils Absolute: 0 10*3/uL (ref 0.0–0.1)
Basophils Relative: 0.3 % (ref 0.0–3.0)
Eosinophils Absolute: 0.2 10*3/uL (ref 0.0–0.7)
Eosinophils Relative: 2.9 % (ref 0.0–5.0)
HCT: 48.1 % (ref 39.0–52.0)
Hemoglobin: 16.5 g/dL (ref 13.0–17.0)
Lymphocytes Relative: 23.3 % (ref 12.0–46.0)
Lymphs Abs: 1.8 10*3/uL (ref 0.7–4.0)
MCHC: 34.2 g/dL (ref 30.0–36.0)
MCV: 86 fl (ref 78.0–100.0)
Monocytes Absolute: 0.6 10*3/uL (ref 0.1–1.0)
Monocytes Relative: 7.9 % (ref 3.0–12.0)
Neutro Abs: 5 10*3/uL (ref 1.4–7.7)
Neutrophils Relative %: 65.6 % (ref 43.0–77.0)
Platelets: 247 10*3/uL (ref 150.0–400.0)
RBC: 5.59 Mil/uL (ref 4.22–5.81)
RDW: 13.1 % (ref 11.5–15.5)
WBC: 7.7 10*3/uL (ref 4.0–10.5)

## 2020-10-27 LAB — LIPID PANEL
Cholesterol: 158 mg/dL (ref 0–200)
HDL: 37.5 mg/dL — ABNORMAL LOW (ref >39.00)
LDL Cholesterol: 102 mg/dL — ABNORMAL HIGH (ref 0–99)
NonHDL: 120.46
Total CHOL/HDL Ratio: 4
Triglycerides: 92 mg/dL (ref 0.0–149.0)
VLDL: 18.4 mg/dL (ref 0.0–40.0)

## 2020-10-27 NOTE — Addendum Note (Signed)
Addended by: Gwenevere Abbot on: 10/27/2020 10:47 AM   Modules accepted: Orders

## 2020-10-27 NOTE — Progress Notes (Signed)
Subjective:    Patient ID: Calvin Cola., male    DOB: [birth year], 35 y.o.   MRN: [id number]  HPI Pt in for cpe.  Pt is  fasting.  Will get cbc, cmp and lipid panel.  Pt defers flu vaccine.  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  Pt bp is high when I checked  He is on bp medication. He forgot to take med. Forgot last year as well.  Pt does not smoke.  Pt has not had covid vaccine. Declines. He test positive for covid 10-13-2020. Only had fever for one day.     Review of Systems  Constitutional: Negative for chills, diaphoresis, fatigue and fever.  HENT: Negative for congestion, drooling, ear discharge and ear pain.   Respiratory: Negative for cough, chest tightness, shortness of breath and wheezing.   Cardiovascular: Negative for chest pain and palpitations.  Gastrointestinal: Negative for abdominal pain.  Genitourinary: Negative for dysuria, flank pain and frequency.  Musculoskeletal: Negative for back pain.  Skin: Negative for rash.  Neurological: Negative for dizziness, speech difficulty, weakness, numbness and headaches.  Hematological: Negative for adenopathy. Does not bruise/bleed easily.  Psychiatric/Behavioral: Negative for behavioral problems and confusion.    Past Medical History:  Diagnosis Date  . Allergy   . Hypertension   . MRSA infection    on elbow and knee     Social History   Socioeconomic History  . Marital status: Single    Spouse name: Not on file  . Number of children: 2  . Years of education: 61  . Highest education level: Not on file  Occupational History  . Not on file  Tobacco Use  . Smoking status: Never Smoker  . Smokeless tobacco: Current User    Types: Chew  Vaping Use  . Vaping Use: Never used  Substance and Sexual Activity  . Alcohol use: Yes    Alcohol/week: 12.0 standard drinks    Types: 12 Cans of beer per week  . Drug use: No  . Sexual activity: Yes  Other Topics  Concern  . Not on file  Social History Narrative   Lives at home with his girlfriend and children   Right handed   Was drinking 2 monsters per day but he has since stopped caffeine       Patient is married but separated since 2012, works as a Psychologist, occupational, 4 night shifts per week, 2 children, 5 y/o (DOB 2010) and 4 y/o (2011), eating is inconsistent, snacks a lot, stays pretty busy with work, does not exercise.   Social Determinants of Health   Financial Resource Strain: Not on file  Food Insecurity: Not on file  Transportation Needs: Not on file  Physical Activity: Not on file  Stress: Not on file  Social Connections: Not on file  Intimate Partner Violence: Not on file    Past Surgical History:  Procedure Laterality Date  . NAILBED REPAIR  07/13/2012   Procedure: NAILBED REPAIR;  Surgeon: Marlowe Shores, MD;  Location: [facility];  Service: Orthopedics;  Laterality: Left;  Left index finger    Family History  Problem Relation Age of Onset  . Hypertension Mother   . Obesity Mother   . Diabetes Mother   . Hypertension Father   . Diabetes Father   . Diabetes Sister   . Diabetes Sister   . Alcohol abuse Paternal Grandfather   . Alcohol abuse Paternal Uncle  Allergies  Allergen Reactions  . Sulfa Antibiotics     Current Outpatient Medications on File Prior to Visit  Medication Sig Dispense Refill  . amLODipine (NORVASC) 5 MG tablet Take 1 tablet (5 mg total) by mouth daily. 90 tablet 3  . fluticasone (FLONASE) 50 MCG/ACT nasal spray Place 2 sprays into both nostrils daily. 16 g 1  . ibuprofen (ADVIL,MOTRIN) 200 MG tablet Take 400 mg by mouth as needed.    Marland Kitchen levocetirizine (XYZAL) 5 MG tablet Take 1 tablet (5 mg total) by mouth every evening. 30 tablet 1   No current facility-administered medications on file prior to visit.    BP (!) 143/100   Pulse 79   Resp 18   Ht 5\' 9"  (1.753 m)   Wt 246 lb 9.6 oz (111.9 kg)   SpO2 98%   BMI 36.42 kg/m        Objective:   Physical Exam  General Mental Status- Alert. General Appearance- Not in acute distress.   Skin General: Color- Normal Color. Moisture- Normal Moisture.  Neck Carotid Arteries- Normal color. Moisture- Normal Moisture. No carotid bruits. No JVD.  Chest and Lung Exam Auscultation: Breath Sounds:-Normal.  Cardiovascular Auscultation:Rythm- Regular. Murmurs & Other Heart Sounds:Auscultation of the heart reveals- No Murmurs.  Abdomen Inspection:-Inspeection Normal. Palpation/Percussion:Note:No mass. Palpation and Percussion of the abdomen reveal- Non Tender, Non Distended + BS, no rebound or guarding.   Neurologic Cranial Nerve exam:- CN III-XII intact(No nystagmus), symmetric smile. Strength:- 5/5 equal and symmetric strength both upper and lower extremities.      Assessment & Plan:  For you wellness exam today I have ordered cbc, cmp and  lipid panel.  Vaccine flu and tdap declined.  Recommend exercise and healthy diet.  We will let you know lab results as they come in.  Follow up date appointment will be determined after lab review. Please restart amlodipine daily. Then check bp every other day starting in one week. Then update me on bp reading in 2 weeks.  Your bp is high again and was high last time. , PA-C

## 2020-10-27 NOTE — Patient Instructions (Addendum)
For you wellness exam today. I put in labs today cbc, cmp and lipid panel.  Vaccine flu and tdap declined.  Recommend exercise and healthy diet.  We will let you know lab results as they come in.  Follow up date appointment will be determined after lab review.   Your bp is high again and was high last time. Please restart amlodipine daily. Then check bp every other day starting in one week. Then update me on bp reading in 2 weeks.   Preventive Care 66-43 Years Old, Male Preventive care refers to lifestyle choices and visits with your health care provider that can promote health and wellness. This includes:  A yearly physical exam. This is also called an annual wellness visit.  Regular dental and eye exams.  Immunizations.  Screening for certain conditions.  Healthy lifestyle choices, such as: ? Eating a healthy diet. ? Getting regular exercise. ? Not using drugs or products that contain nicotine and tobacco. ? Limiting alcohol use. What can I expect for my preventive care visit? Physical exam Your health care provider may check your:  Height and weight. These may be used to calculate your BMI (body mass index). BMI is a measurement that tells if you are at a healthy weight.  Heart rate and blood pressure.  Body temperature.  Skin for abnormal spots. Counseling Your health care provider may ask you questions about your:  Past medical problems.  Family's medical history.  Alcohol, tobacco, and drug use.  Emotional well-being.  Home life and relationship well-being.  Sexual activity.  Diet, exercise, and sleep habits.  Work and work Astronomer.  Access to firearms. What immunizations do I need? Vaccines are usually given at various ages, according to a schedule. Your health care provider will recommend vaccines for you based on your age, medical history, and lifestyle or other factors, such as travel or where you work.   What tests do I need? Blood  tests  Lipid and cholesterol levels. These may be checked every 5 years starting at age 21.  Hepatitis C test.  Hepatitis B test. Screening  Diabetes screening. This is done by checking your blood sugar (glucose) after you have not eaten for a while (fasting).  Genital exam to check for testicular cancer or hernias.  STD (sexually transmitted disease) testing, if you are at risk. Talk with your health care provider about your test results, treatment options, and if necessary, the need for more tests.   Follow these instructions at home: Eating and drinking  Eat a healthy diet that includes fresh fruits and vegetables, whole grains, lean protein, and low-fat dairy products.  Drink enough fluid to keep your urine pale yellow.  Take vitamin and mineral supplements as recommended by your health care provider.  Do not drink alcohol if your health care provider tells you not to drink.  If you drink alcohol: ? Limit how much you have to 0-2 drinks a day. ? Be aware of how much alcohol is in your drink. In the [place] one drink equals one 12 oz bottle of beer (355 mL), one 5 oz glass of wine (148 mL), or one 1 oz glass of hard liquor (44 mL).   Lifestyle  Take daily care of your teeth and gums. Brush your teeth every morning and night with fluoride toothpaste. Floss one time each day.  Stay active. Exercise for at least 30 minutes 5 or more days each week.  Do not use any products that contain nicotine or  tobacco, such as cigarettes, e-cigarettes, and chewing tobacco. If you need help quitting, ask your health care provider.  Do not use drugs.  If you are sexually active, practice safe sex. Use a condom or other form of protection to prevent STIs (sexually transmitted infections).  Find healthy ways to cope with stress, such as: ? Meditation, yoga, or listening to music. ? Journaling. ? Talking to a trusted person. ? Spending time with friends and family. Safety  Always wear  your seat belt while driving or riding in a vehicle.  Do not drive: ? If you have been drinking alcohol. Do not ride with someone who has been drinking. ? When you are tired or distracted. ? While texting.  Wear a helmet and other protective equipment during sports activities.  If you have firearms in your house, make sure you follow all gun safety procedures.  Seek help if you have been physically or sexually abused. What's next?  Go to your health care provider once a year for an annual wellness visit.  Ask your health care provider how often you should have your eyes and teeth checked.  Stay up to date on all vaccines. This information is not intended to replace advice given to you by your health care provider. Make sure you discuss any questions you have with your health care provider. Document Revised: 06/02/2019 Document Reviewed: 09/10/2018 Elsevier Patient Education  2021 [employer].

## 2021-03-05 ENCOUNTER — Telehealth: Payer: Self-pay | Admitting: *Deleted

## 2021-03-05 ENCOUNTER — Telehealth: Payer: Self-pay | Admitting: Medical

## 2021-03-05 MED ORDER — AMLODIPINE BESYLATE 5 MG PO TABS: 5.0000 mg | ORAL_TABLET | Freq: Every day | ORAL | 3 refills | Status: DC

## 2021-03-05 NOTE — Telephone Encounter (Signed)
Rx sent 

## 2021-03-05 NOTE — Telephone Encounter (Signed)
Medication: amLODipine (NORVASC) 5 MG tablet [[id number]     Has the patient contacted their pharmacy? no (If no, request that the patient contact the pharmacy for the refill.) (If yes, when and what did the pharmacy advise?)    Preferred Pharmacy (with phone number or street name): [facility] [address] ([address]), [place] - [address]  [address]) [place] [address]  Phone:  [phone] Fax:  [phone]     Agent: Please be advised that RX refills may take up to 3 business days. We ask that you follow-up with your pharmacy.

## 2021-03-05 NOTE — Telephone Encounter (Signed)
Who Is Calling Patient / Member / Family / Caregiver Caller Name KIRIN PASTORINO Phone Number [phone] Patient Name Calvin Sandoval Patient DOB 25-Sep-1986 Call Type Message Only Information Provided Reason for Call Request for General Office Information Initial Comment Caller says he ran out of his Blood Pressure med, no symptoms, will seek a loaner dose from Pharmacy Disp. Time Disposition Final User 03/03/2021 9:51:26 AM General Information Provided Yes Penny Pia

## 2021-03-05 NOTE — Telephone Encounter (Signed)
Medication sent to [facility].  

## 2021-03-27 DIAGNOSIS — Z131 Encounter for screening for diabetes mellitus: Secondary | ICD-10-CM | POA: Diagnosis not present

## 2021-03-27 DIAGNOSIS — L03115 Cellulitis of right lower limb: Secondary | ICD-10-CM | POA: Diagnosis not present

## 2021-03-27 DIAGNOSIS — T63301A Toxic effect of unspecified spider venom, accidental (unintentional), initial encounter: Secondary | ICD-10-CM | POA: Diagnosis not present

## 2021-03-27 DIAGNOSIS — E669 Obesity, unspecified: Secondary | ICD-10-CM | POA: Diagnosis not present

## 2021-03-27 DIAGNOSIS — R030 Elevated blood-pressure reading, without diagnosis of hypertension: Secondary | ICD-10-CM | POA: Diagnosis not present

## 2021-06-29 DIAGNOSIS — H04123 Dry eye syndrome of bilateral lacrimal glands: Secondary | ICD-10-CM | POA: Diagnosis not present

## 2021-06-29 DIAGNOSIS — H40033 Anatomical narrow angle, bilateral: Secondary | ICD-10-CM | POA: Diagnosis not present

## 2021-10-16 ENCOUNTER — Telehealth: Payer: Self-pay | Admitting: Medical

## 2021-10-16 MED ORDER — AMLODIPINE BESYLATE 5 MG PO TABS: 5.0000 mg | ORAL_TABLET | Freq: Every day | ORAL | 3 refills | Status: AC

## 2021-10-16 NOTE — Telephone Encounter (Signed)
Rx sent 

## 2021-10-16 NOTE — Telephone Encounter (Signed)
Medication:  amLODipine (NORVASC) 5 MG tablet [id number]     Has the patient contacted their pharmacy? No. (If no, request that the patient contact the pharmacy for the refill.) (If yes, when and what did the pharmacy advise?)     Preferred Pharmacy (with phone number or street name):  [facility] ([address] [place] - [place]  [id number] [place]) [place] [address]  Phone:  [phone]  Fax:  [phone]    Agent: Please be advised that RX refills may take up to 3 business days. We ask that you follow-up with your pharmacy.   Pt has an upcoming appt sched 10/29/21

## 2021-10-29 ENCOUNTER — Encounter: Payer: Self-pay | Admitting: Medical

## 2021-10-29 ENCOUNTER — Ambulatory Visit (INDEPENDENT_AMBULATORY_CARE_PROVIDER_SITE_OTHER): Payer: BC Managed Care – PPO | Admitting: Medical

## 2021-10-29 VITALS — BP 120/80 | HR 82 | Resp 18 | Ht 69.0 in | Wt 238.0 lb

## 2021-10-29 DIAGNOSIS — K0510 Chronic gingivitis, plaque induced: Secondary | ICD-10-CM | POA: Diagnosis not present

## 2021-10-29 DIAGNOSIS — I10 Essential (primary) hypertension: Secondary | ICD-10-CM

## 2021-10-29 DIAGNOSIS — Z0000 Encounter for general adult medical examination without abnormal findings: Secondary | ICD-10-CM

## 2021-10-29 NOTE — Progress Notes (Signed)
Subjective:    Patient ID: Calvin Cola., male    DOB: 11-11-1985, 36 y.o.   MRN: [id number]  HPI  Pt in for cpe.   Pt is  fasting.  Will get cbc, cmp and lipid panel.   Pt defers flu vaccine.   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   Pt bp is high when I checked  He is on bp medication. He states when he checks bp 145-150 systolic though he does not check often.  Pt quit chewing tobacco.   Pt does not smoke.    Review of Systems  Constitutional:  Negative for appetite change, diaphoresis, fatigue and fever.  HENT:  Negative for congestion.   Respiratory:  Negative for cough, chest tightness, shortness of breath and wheezing.   Cardiovascular:  Negative for chest pain and palpitations.  Gastrointestinal:  Negative for abdominal pain, constipation, diarrhea, nausea and rectal pain.  Genitourinary:  Negative for dysuria, frequency, hematuria and penile pain.  Musculoskeletal:  Negative for back pain, joint swelling and myalgias.  Skin:  Negative for rash.  Neurological:  Negative for dizziness, light-headedness and headaches.  Hematological:  Negative for adenopathy. Does not bruise/bleed easily.  Psychiatric/Behavioral:  Negative for behavioral problems and confusion. The patient is not nervous/anxious.     Past Medical History:  Diagnosis Date   Allergy    Hypertension    MRSA infection    on elbow and knee     Social History   Socioeconomic History   Marital status: Single    Spouse name: Not on file   Number of children: 2   Years of education: 5   Highest education level: Not on file  Occupational History   Not on file  Tobacco Use   Smoking status: Never   Smokeless tobacco: Current    Types: Chew  Vaping Use   Vaping Use: Never used  Substance and Sexual Activity   Alcohol use: Yes    Alcohol/week: 12.0 standard drinks    Types: 12 Cans of beer per week   Drug use: No   Sexual activity: Yes  Other  Topics Concern   Not on file  Social History Narrative   Lives at home with his girlfriend and children   Right handed   Was drinking 2 monsters per day but he has since stopped caffeine       Patient is married but separated since 2012, works as a Psychologist, occupational, 4 night shifts per week, 2 children, 5 y/o (DOB 2010) and 4 y/o (2011), eating is inconsistent, snacks a lot, stays pretty busy with work, does not exercise.   Social Determinants of Health   Financial Resource Strain: Not on file  Food Insecurity: Not on file  Transportation Needs: Not on file  Physical Activity: Not on file  Stress: Not on file  Social Connections: Not on file  Intimate Partner Violence: Not on file    Past Surgical History:  Procedure Laterality Date   NAILBED REPAIR  07/13/2012   Procedure: NAILBED REPAIR;  Surgeon: Marlowe Shores, MD;  Location: [facility];  Service: Orthopedics;  Laterality: Left;  Left index finger    Family History  Problem Relation Age of Onset   Hypertension Mother    Obesity Mother    Diabetes Mother    Hypertension Father    Diabetes Father    Diabetes Sister    Diabetes Sister    Alcohol abuse  Paternal Grandfather    Alcohol abuse Paternal Uncle     Allergies  Allergen Reactions   Sulfa Antibiotics     Current Outpatient Medications on File Prior to Visit  Medication Sig Dispense Refill   amLODipine (NORVASC) 5 MG tablet Take 1 tablet (5 mg total) by mouth daily. 90 tablet 3   fluticasone (FLONASE) 50 MCG/ACT nasal spray Place 2 sprays into both nostrils daily. 16 g 1   ibuprofen (ADVIL,MOTRIN) 200 MG tablet Take 400 mg by mouth as needed.     levocetirizine (XYZAL) 5 MG tablet Take 1 tablet (5 mg total) by mouth every evening. 30 tablet 1   No current facility-administered medications on file prior to visit.    BP (!) 150/88    Pulse 82    Resp 18    Ht 5\' 9"  (1.753 m)    Wt 238 lb (108 kg)    SpO2 98%    BMI 35.15 kg/m        Objective:    Physical Exam   General Mental Status- Alert. General Appearance- Not in acute distress.   Skin General: Color- Normal Color. Moisture- Normal Moisture.  Neck Carotid Arteries- Normal color. Moisture- Normal Moisture. No carotid bruits. No JVD.  Chest and Lung Exam Auscultation: Breath Sounds:-Normal.  Cardiovascular Auscultation:Rythm- Regular. Murmurs & Other Heart Sounds:Auscultation of the heart reveals- No Murmurs.  Abdomen Inspection:-Inspeection Normal. Palpation/Percussion:Note:No mass. Palpation and Percussion of the abdomen reveal- Non Tender, Non Distended + BS, no rebound or guarding.   Neurologic Cranial Nerve exam:- CN III-XII intact(No nystagmus), symmetric smile. Strength:- 5/5 equal and symmetric strength both upper and lower extremities.   Mouth- on exam of mouth does appear to gingivitis.     Assessment & Plan:  For you wellness exam today I have ordered cbc, cmp and  lipid panel.  Tdap declined. As well as covid and flu.  Recommend exercise and healthy diet.  We will let you know lab results as they come in.  Follow up date appointment will be determined after lab review.  Recommend frequency at 6 month intervals as well for bp check in office.  Htn- bp better controlled on recheck. Contniue amlodipine 5 mg daily.  Good job on stopping chewing tobacco. On exam of mouth some gingivitis present. About one month since quiting. Would recommend that you see dentist for advise on gum health. Pt agreed would go for cleaning and exam.  Mackie Pai, PA-C

## 2021-10-29 NOTE — Patient Instructions (Addendum)
For you wellness exam today I have ordered cbc, cmp and  lipid panel.  Tdap declined. As well as covid and flu.  Recommend exercise and healthy diet.  We will let you know lab results as they come in.  Follow up date appointment will be determined after lab review.  Recommend frequency at 6 month intervals as well for bp check in office.  Htn- bp better controlled on recheck. Contniue amlodipine 5 mg daily.  Good job on stopping chewing tobacco. On exam of mouth some gingivitis present. About one month since quiting. Would recommend that you see dentist for advise on gum health. Pt agreed would go for cleaning and exam.  Preventive Care 70-42 Years Old, Male Preventive care refers to lifestyle choices and visits with your health care provider that can promote health and wellness. Preventive care visits are also called wellness exams. What can I expect for my preventive care visit? Counseling During your preventive care visit, your health care provider may ask about your: Medical history, including: Past medical problems. Family medical history. Current health, including: Emotional well-being. Home life and relationship well-being. Sexual activity. Lifestyle, including: Alcohol, nicotine or tobacco, and drug use. Access to firearms. Diet, exercise, and sleep habits. Safety issues such as seatbelt and bike helmet use. Sunscreen use. Work and work Astronomer. Physical exam Your health care provider may check your: Height and weight. These may be used to calculate your BMI (body mass index). BMI is a measurement that tells if you are at a healthy weight. Waist circumference. This measures the distance around your waistline. This measurement also tells if you are at a healthy weight and may help predict your risk of certain diseases, such as type 2 diabetes and high blood pressure. Heart rate and blood pressure. Body temperature. Skin for abnormal spots. What immunizations do I  need? Vaccines are usually given at various ages, according to a schedule. Your health care provider will recommend vaccines for you based on your age, medical history, and lifestyle or other factors, such as travel or where you work. What tests do I need? Screening Your health care provider may recommend screening tests for certain conditions. This may include: Lipid and cholesterol levels. Diabetes screening. This is done by checking your blood sugar (glucose) after you have not eaten for a while (fasting). Hepatitis B test. Hepatitis C test. HIV (human immunodeficiency virus) test. STI (sexually transmitted infection) testing, if you are at risk. Talk with your health care provider about your test results, treatment options, and if necessary, the need for more tests. Follow these instructions at home: Eating and drinking  Eat a healthy diet that includes fresh fruits and vegetables, whole grains, lean protein, and low-fat dairy products. Drink enough fluid to keep your urine pale yellow. Take vitamin and mineral supplements as recommended by your health care provider. Do not drink alcohol if your health care provider tells you not to drink. If you drink alcohol: Limit how much you have to 0-2 drinks a day. Know how much alcohol is in your drink. In the [place] one drink equals one 12 oz bottle of beer (355 mL), one 5 oz glass of wine (148 mL), or one 1 oz glass of hard liquor (44 mL). Lifestyle Brush your teeth every morning and night with fluoride toothpaste. Floss one time each day. Exercise for at least 30 minutes 5 or more days each week. Do not use any products that contain nicotine or tobacco. These products include cigarettes, chewing tobacco,  and vaping devices, such as e-cigarettes. If you need help quitting, ask your health care provider. Do not use drugs. If you are sexually active, practice safe sex. Use a condom or other form of protection to prevent STIs. Find healthy ways  to manage stress, such as: Meditation, yoga, or listening to music. Journaling. Talking to a trusted person. Spending time with friends and family. Minimize exposure to UV radiation to reduce your risk of skin cancer. Safety Always wear your seat belt while driving or riding in a vehicle. Do not drive: If you have been drinking alcohol. Do not ride with someone who has been drinking. If you have been using any mind-altering substances or drugs. While texting. When you are tired or distracted. Wear a helmet and other protective equipment during sports activities. If you have firearms in your house, make sure you follow all gun safety procedures. Seek help if you have been physically or sexually abused. What's next? Go to your health care provider once a year for an annual wellness visit. Ask your health care provider how often you should have your eyes and teeth checked. Stay up to date on all vaccines. This information is not intended to replace advice given to you by your health care provider. Make sure you discuss any questions you have with your health care provider. Document Revised: 03/14/2021 Document Reviewed: 03/14/2021 Elsevier Patient Education  2022 [employer].

## 2023-05-23 NOTE — Progress Notes (Signed)
This encounter was created in error - please disregard.

## 2024-01-20 ENCOUNTER — Encounter (HOSPITAL_COMMUNITY): Payer: Self-pay | Admitting: Emergency Medicine

## 2024-01-20 ENCOUNTER — Emergency Department (HOSPITAL_COMMUNITY)
Admission: EM | Admit: 2024-01-20 | Discharge: 2024-01-21 | Disposition: A | Discharge status: Home / Self Care | Payer: Self-pay | Attending: Emergency Medicine | Admitting: Emergency Medicine

## 2024-01-20 ENCOUNTER — Other Ambulatory Visit: Payer: Self-pay

## 2024-01-20 ENCOUNTER — Emergency Department (HOSPITAL_COMMUNITY): Payer: Self-pay

## 2024-01-20 DIAGNOSIS — Y92410 Unspecified street and highway as the place of occurrence of the external cause: Secondary | ICD-10-CM | POA: Insufficient documentation

## 2024-01-20 DIAGNOSIS — R0789 Other chest pain: Secondary | ICD-10-CM | POA: Diagnosis present
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
## 2024-01-21 NOTE — ED Notes (Signed)
 Called multiple times for vitals, no response.

## 2024-01-21 NOTE — Discharge Instructions (Addendum)
 Return for any problem.   Use ibuprofen - 600 mg every 8 hours - for pain.
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
## 2024-10-14 ENCOUNTER — Emergency Department (HOSPITAL_COMMUNITY)

## 2024-10-14 ENCOUNTER — Encounter (HOSPITAL_COMMUNITY): Payer: Self-pay

## 2024-10-14 ENCOUNTER — Emergency Department (HOSPITAL_COMMUNITY)
Admission: EM | Admit: 2024-10-14 | Discharge: 2024-10-14 | Disposition: A | Discharge status: Home / Self Care | Attending: Emergency Medicine | Admitting: Emergency Medicine

## 2024-10-14 DIAGNOSIS — F1010 Alcohol abuse, uncomplicated: Secondary | ICD-10-CM | POA: Insufficient documentation

## 2024-10-14 DIAGNOSIS — Z79899 Other long term (current) drug therapy: Secondary | ICD-10-CM | POA: Insufficient documentation

## 2024-10-14 DIAGNOSIS — Y908 Blood alcohol level of 240 mg/100 ml or more: Secondary | ICD-10-CM | POA: Insufficient documentation

## 2024-10-14 DIAGNOSIS — S2222XA Fracture of body of sternum, initial encounter for closed fracture: Secondary | ICD-10-CM | POA: Insufficient documentation

## 2024-10-14 DIAGNOSIS — S299XXA Unspecified injury of thorax, initial encounter: Secondary | ICD-10-CM | POA: Diagnosis present

## 2024-10-14 DIAGNOSIS — Y92410 Unspecified street and highway as the place of occurrence of the external cause: Secondary | ICD-10-CM | POA: Diagnosis not present

## 2024-10-14 DIAGNOSIS — I10 Essential (primary) hypertension: Secondary | ICD-10-CM | POA: Diagnosis not present

## 2024-10-14 LAB — SAMPLE TO BLOOD BANK

## 2024-10-14 LAB — COMPREHENSIVE METABOLIC PANEL WITH GFR
ALT: 47 U/L — ABNORMAL HIGH (ref 0–44)
AST: 36 U/L (ref 15–41)
Albumin: 5 g/dL (ref 3.5–5.0)
Alkaline Phosphatase: 67 U/L (ref 38–126)
Anion gap: 14 (ref 5–15)
BUN: 11 mg/dL (ref 6–20)
CO2: 23 mmol/L (ref 22–32)
Calcium: 9.4 mg/dL (ref 8.9–10.3)
Chloride: 102 mmol/L (ref 98–111)
Creatinine, Ser: 1.15 mg/dL (ref 0.61–1.24)
GFR, Estimated: >60 mL/min
Glucose, Bld: 96 mg/dL (ref 70–99)
Potassium: 3.8 mmol/L (ref 3.5–5.1)
Sodium: 140 mmol/L (ref 135–145)
Total Bilirubin: 0.3 mg/dL (ref 0.0–1.2)
Total Protein: 8.4 g/dL — ABNORMAL HIGH (ref 6.5–8.1)

## 2024-10-14 LAB — I-STAT CHEM 8, ED
BUN: 11 mg/dL (ref 6–20)
Calcium, Ion: 1.13 mmol/L — ABNORMAL LOW (ref 1.15–1.40)
Chloride: 103 mmol/L (ref 98–111)
Creatinine, Ser: 1.6 mg/dL — ABNORMAL HIGH (ref 0.61–1.24)
Glucose, Bld: 97 mg/dL (ref 70–99)
HCT: 54 % — ABNORMAL HIGH (ref 39.0–52.0)
Hemoglobin: 18.4 g/dL — ABNORMAL HIGH (ref 13.0–17.0)
Potassium: 3.8 mmol/L (ref 3.5–5.1)
Sodium: 144 mmol/L (ref 135–145)
TCO2: 26 mmol/L (ref 22–32)

## 2024-10-14 LAB — CBC
HCT: 53.8 % — ABNORMAL HIGH (ref 39.0–52.0)
Hemoglobin: 18.6 g/dL — ABNORMAL HIGH (ref 13.0–17.0)
MCH: 30.2 pg (ref 26.0–34.0)
MCHC: 34.6 g/dL (ref 30.0–36.0)
MCV: 87.5 fL (ref 80.0–100.0)
Platelets: 247 K/uL (ref 150–400)
RBC: 6.15 MIL/uL — ABNORMAL HIGH (ref 4.22–5.81)
RDW: 11.9 % (ref 11.5–15.5)
WBC: 8 K/uL (ref 4.0–10.5)
nRBC: 0 % (ref 0.0–0.2)

## 2024-10-14 LAB — PROTIME-INR
INR: 0.9 (ref 0.8–1.2)
Prothrombin Time: 12.3 s (ref 11.4–15.2)

## 2024-10-14 LAB — I-STAT CG4 LACTIC ACID, ED: Lactic Acid, Venous: 2 mmol/L (ref 0.5–1.9)

## 2024-10-14 LAB — ETHANOL: Alcohol, Ethyl (B): 307 mg/dL

## 2024-10-14 MED ORDER — LACTATED RINGERS IV BOLUS
1000.0000 mL | Freq: Once | INTRAVENOUS | Status: AC
  Administered 2024-10-14: 1000 mL via INTRAVENOUS

## 2024-10-14 MED ORDER — ACETAMINOPHEN ER 650 MG PO TBCR: 650.0000 mg | EXTENDED_RELEASE_TABLET | Freq: Three times a day (TID) | ORAL | 0 refills | Status: AC | PRN

## 2024-10-14 MED ORDER — IOHEXOL 350 MG/ML SOLN
75.0000 mL | Freq: Once | INTRAVENOUS | Status: AC | PRN
  Administered 2024-10-14: 75 mL via INTRAVENOUS

## 2024-10-14 MED ORDER — KETOROLAC TROMETHAMINE 15 MG/ML IJ SOLN
15.0000 mg | Freq: Once | INTRAMUSCULAR | Status: AC
  Administered 2024-10-14: 15 mg via INTRAVENOUS
  Filled 2024-10-14: qty 1

## 2024-10-14 MED ORDER — IBUPROFEN 600 MG PO TABS: 600.0000 mg | ORAL_TABLET | Freq: Four times a day (QID) | ORAL | 0 refills | Status: AC | PRN
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
## 2024-10-14 NOTE — ED Notes (Signed)
 When you have time, Avelina Amel (sister) [phone] would like an update on pt. Thank you
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
## 2024-10-14 NOTE — ED Notes (Addendum)
 Pt walking around nurses station. Boisterous behavior . Stating he ready to go home. Pt has a steady gait. PA informed.
